# Patient Record
Sex: Female | Born: 1985 | Race: White | Hispanic: No | Marital: Married | Smoking: Never smoker
Health system: Southern US, Community
[De-identification: ages and names within clinical notes are randomized; demographics above are authoritative.]

## PROBLEM LIST (undated history)

## (undated) ENCOUNTER — Inpatient Hospital Stay: Payer: Self-pay

## (undated) DIAGNOSIS — T4145XA Adverse effect of unspecified anesthetic, initial encounter: Secondary | ICD-10-CM

## (undated) DIAGNOSIS — D649 Anemia, unspecified: Secondary | ICD-10-CM

## (undated) DIAGNOSIS — F419 Anxiety disorder, unspecified: Secondary | ICD-10-CM

## (undated) DIAGNOSIS — T8859XA Other complications of anesthesia, initial encounter: Secondary | ICD-10-CM

## (undated) HISTORY — PX: NO PAST SURGERIES: SHX2092

---

## 2010-09-15 ENCOUNTER — Ambulatory Visit: Payer: Self-pay | Admitting: Family Medicine

## 2011-10-02 ENCOUNTER — Encounter: Payer: Self-pay | Admitting: Obstetrics & Gynecology

## 2011-10-03 ENCOUNTER — Encounter: Payer: Self-pay | Admitting: Obstetrics & Gynecology

## 2012-02-02 ENCOUNTER — Observation Stay: Payer: Self-pay | Admitting: Obstetrics and Gynecology

## 2012-02-02 LAB — URINALYSIS, COMPLETE
Nitrite: NEGATIVE
Protein: NEGATIVE
Specific Gravity: 1.004 (ref 1.003–1.030)
Squamous Epithelial: <1

## 2012-03-21 ENCOUNTER — Observation Stay: Payer: Self-pay | Admitting: Obstetrics and Gynecology

## 2012-03-28 ENCOUNTER — Inpatient Hospital Stay: Payer: Self-pay | Admitting: Obstetrics and Gynecology

## 2012-03-28 LAB — CBC WITH DIFFERENTIAL/PLATELET
Basophil #: 0 10*3/uL (ref 0.0–0.1)
Eosinophil %: 0.5 %
HCT: 40.3 % (ref 35.0–47.0)
HGB: 13.4 g/dL (ref 12.0–16.0)
Lymphocyte #: 1.8 10*3/uL (ref 1.0–3.6)
MCH: 32.4 pg (ref 26.0–34.0)
MCV: 98 fL (ref 80–100)
Monocyte #: 1 x10 3/mm — ABNORMAL HIGH (ref 0.2–0.9)
Neutrophil #: 16.2 10*3/uL — ABNORMAL HIGH (ref 1.4–6.5)
RBC: 4.12 10*6/uL (ref 3.80–5.20)

## 2012-03-28 LAB — RAPID HIV-1/2 QL/CONFIRM: HIV-1/2,Rapid Ql: NEGATIVE

## 2012-12-04 ENCOUNTER — Emergency Department: Payer: Self-pay | Admitting: Emergency Medicine

## 2012-12-04 LAB — COMPREHENSIVE METABOLIC PANEL
Albumin: 4 g/dL (ref 3.4–5.0)
Alkaline Phosphatase: 58 U/L (ref 50–136)
Anion Gap: 5 — ABNORMAL LOW (ref 7–16)
Bilirubin,Total: 0.4 mg/dL (ref 0.2–1.0)
Calcium, Total: 8.6 mg/dL (ref 8.5–10.1)
Chloride: 108 mmol/L — ABNORMAL HIGH (ref 98–107)
Creatinine: 0.68 mg/dL (ref 0.60–1.30)
Glucose: 85 mg/dL (ref 65–99)
SGOT(AST): 16 U/L (ref 15–37)
SGPT (ALT): 17 U/L (ref 12–78)

## 2012-12-04 LAB — URINALYSIS, COMPLETE
Bilirubin,UR: NEGATIVE
Blood: NEGATIVE
Glucose,UR: NEGATIVE mg/dL (ref 0–75)
Ph: 7 (ref 4.5–8.0)
Protein: NEGATIVE
RBC,UR: <1 /HPF (ref 0–5)
Specific Gravity: 1.002 (ref 1.003–1.030)
Squamous Epithelial: 2
WBC UR: 1 /HPF (ref 0–5)

## 2012-12-04 LAB — CBC WITH DIFFERENTIAL/PLATELET
Basophil %: 0.6 %
Eosinophil #: 0.1 10*3/uL (ref 0.0–0.7)
Lymphocyte %: 23.2 %
MCH: 31.4 pg (ref 26.0–34.0)
MCHC: 34.1 g/dL (ref 32.0–36.0)
Monocyte #: 0.4 x10 3/mm (ref 0.2–0.9)
Neutrophil #: 3.3 10*3/uL (ref 1.4–6.5)
Neutrophil %: 67.2 %
Platelet: 131 10*3/uL — ABNORMAL LOW (ref 150–440)
RDW: 13 % (ref 11.5–14.5)

## 2012-12-04 LAB — SEDIMENTATION RATE: Erythrocyte Sed Rate: 13 mm/hr (ref 0–20)

## 2012-12-04 IMAGING — CR DG CHEST 2V
1 series · 2 of 2 positions shown · non-contrast
Comparison: none

REASON FOR EXAM: dyspnea
COMMENTS:

PROCEDURE:     DXR - DXR CHEST PA (OR AP) AND LATERAL  - [DATE] [DATE]
RESULT:     The lungs are clear. The cardiac silhouette and visualized bony
skeleton are unremarkable.

[Series 1: w chest pa · 0.14mm/px · 2 of 2 slices shown]
[im 1/2]
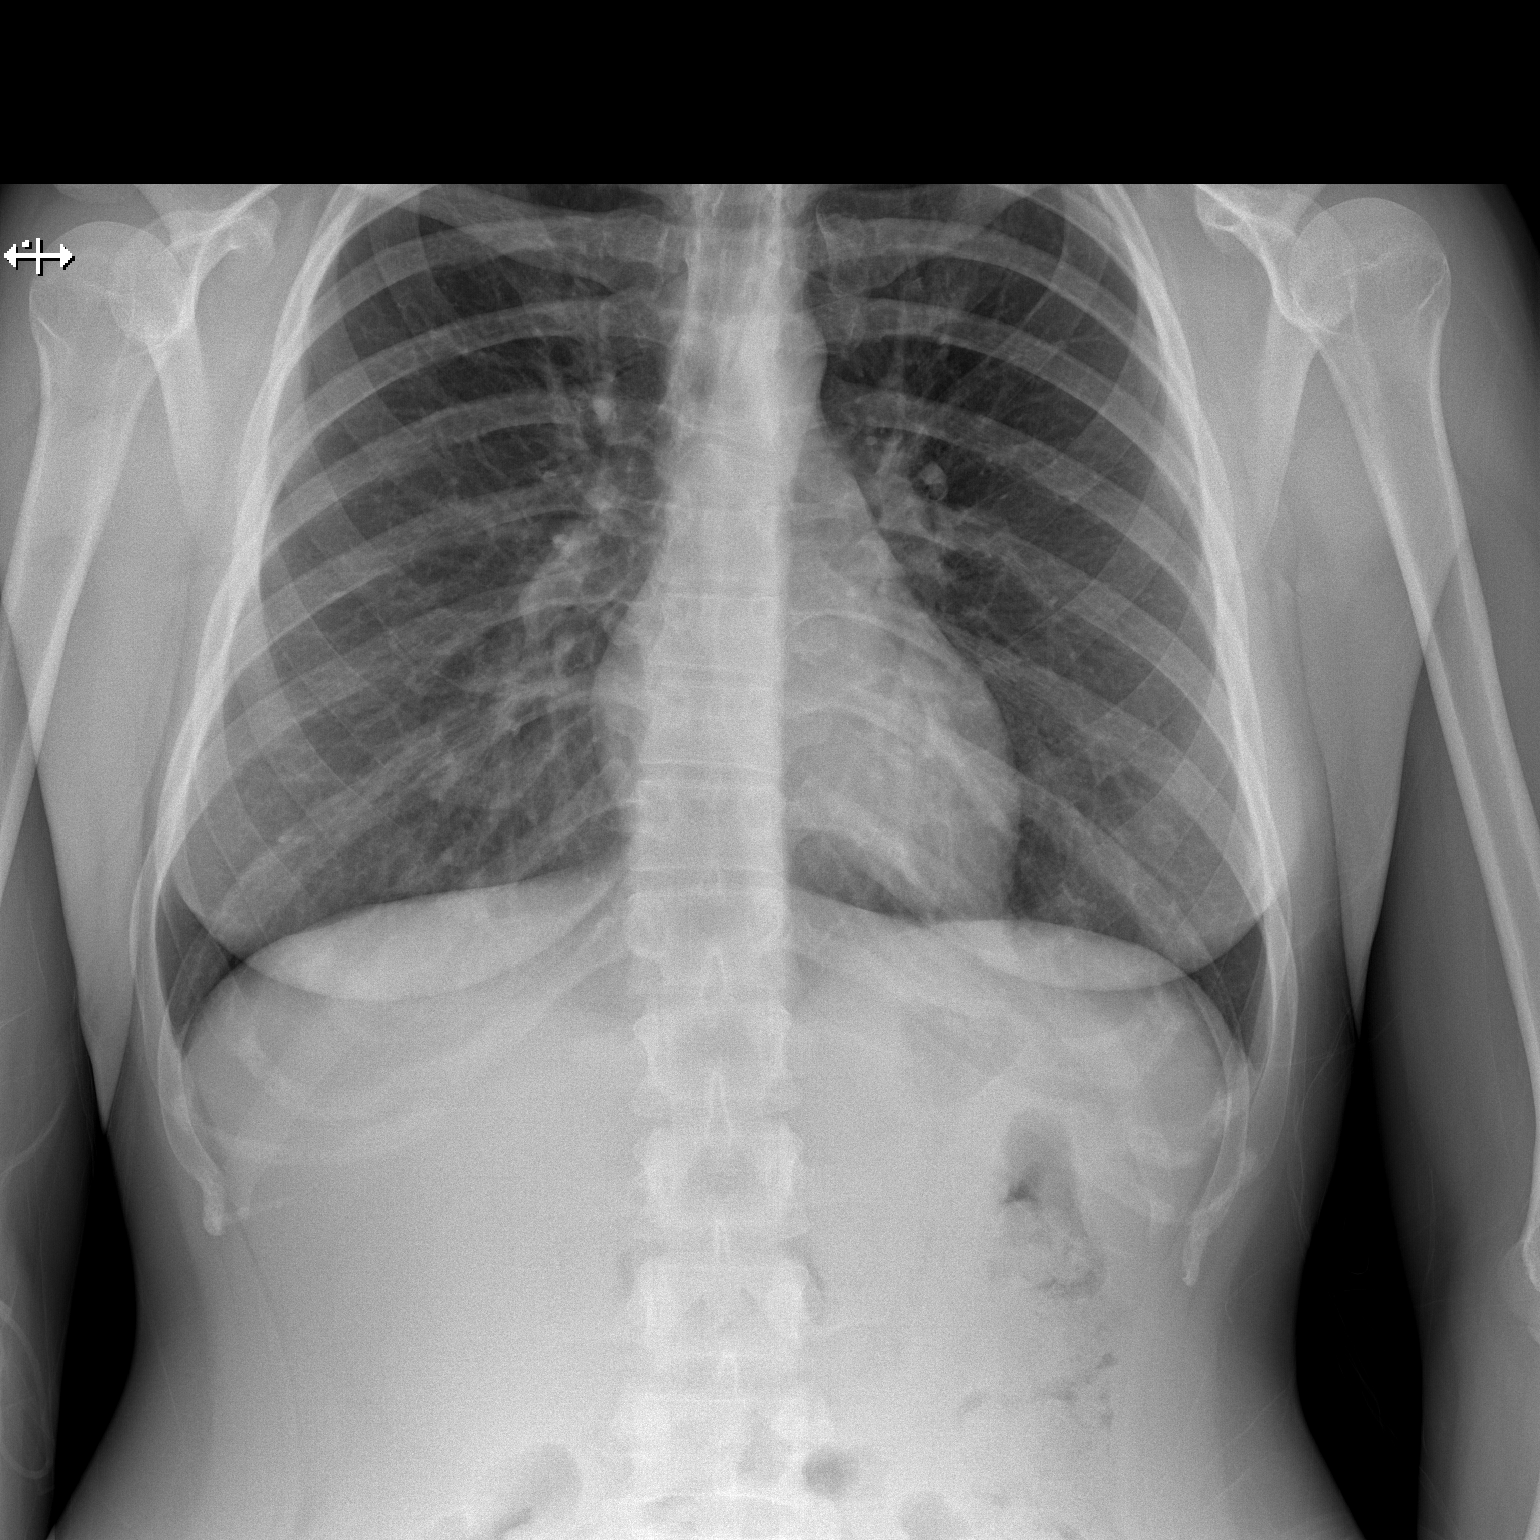
[im 2/2]
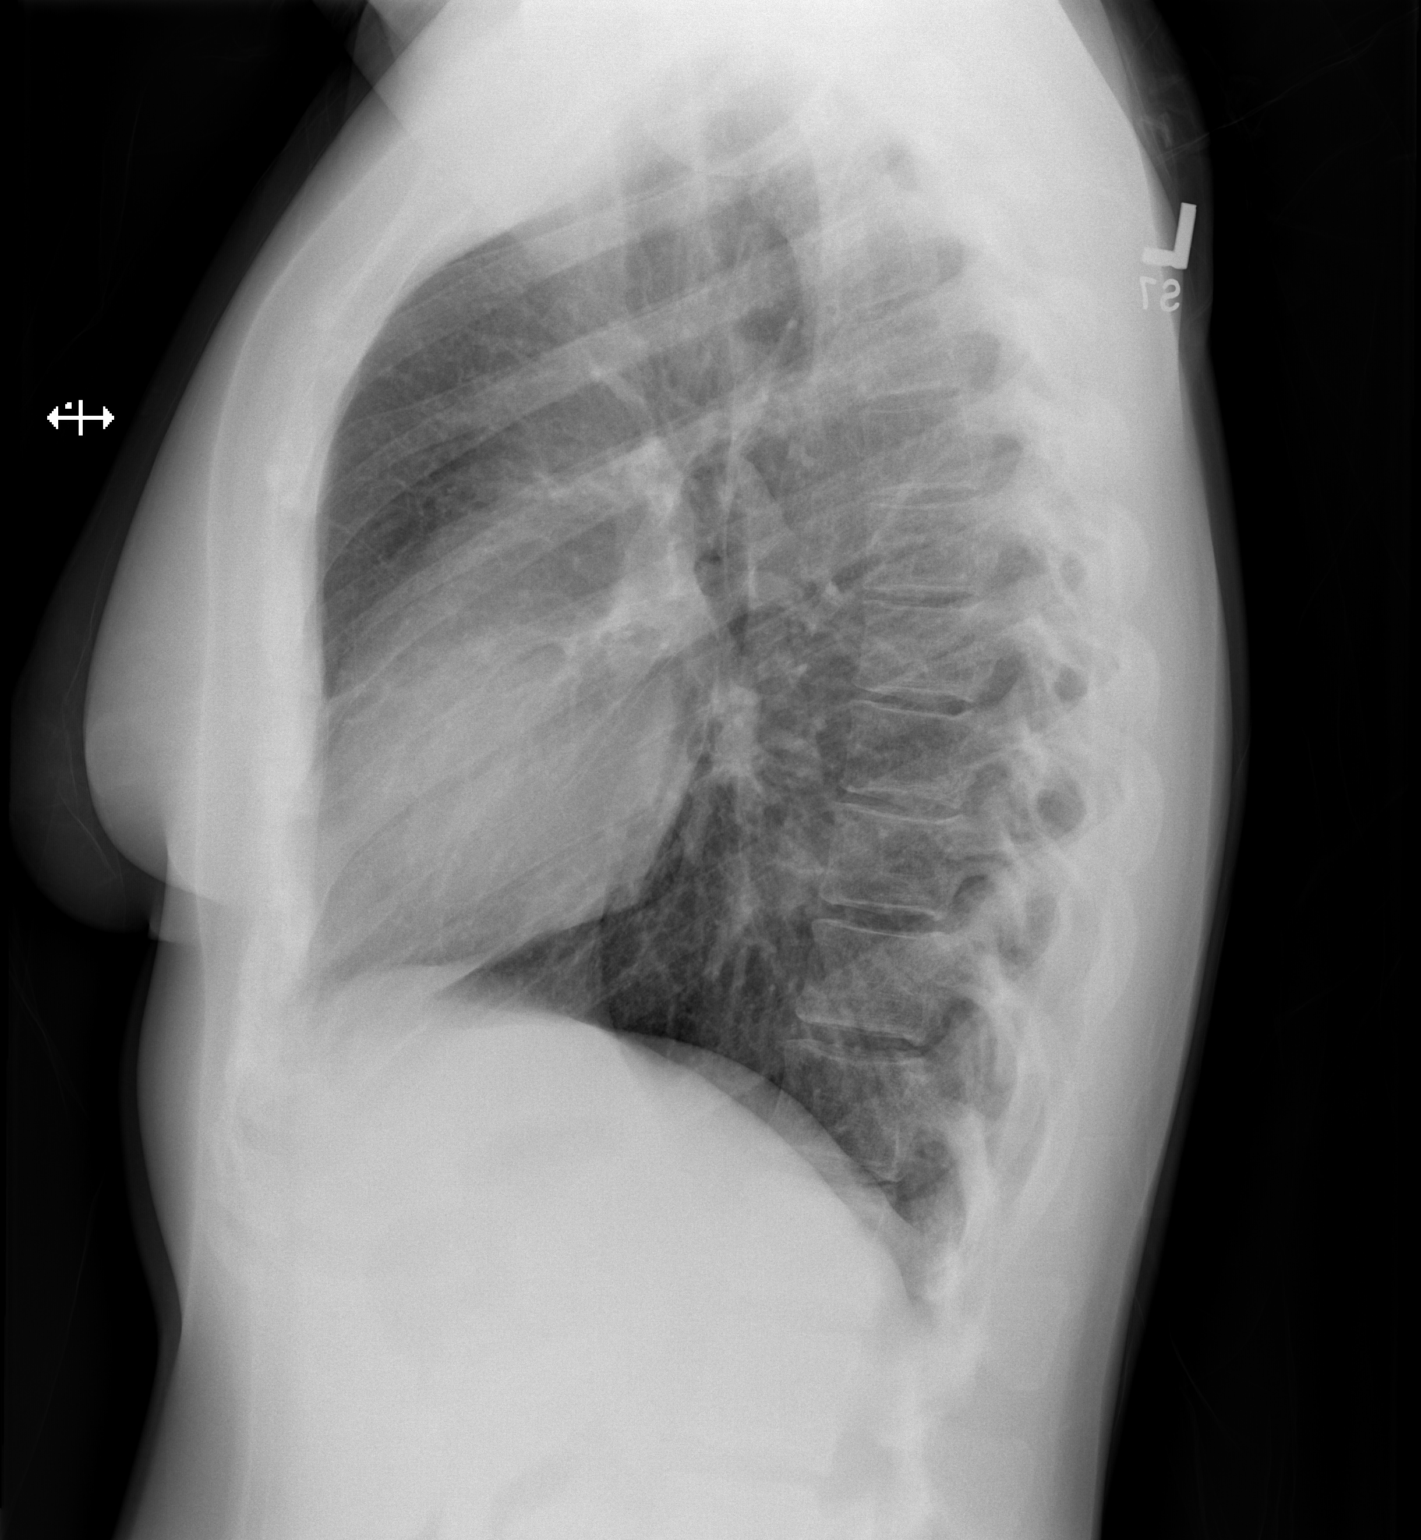

[2 of 2 positions shown; findings below may reference images not displayed]

IMPRESSION: 1. Chest radiograph without evidence of acute cardiopulmonary disease.

## 2015-10-27 ENCOUNTER — Emergency Department: Payer: BC Managed Care – PPO

## 2015-10-27 ENCOUNTER — Emergency Department
Admission: EM | Admit: 2015-10-27 | Discharge: 2015-10-27 | Disposition: A | Discharge status: Home / Self Care | Payer: BC Managed Care – PPO | Attending: Emergency Medicine | Admitting: Emergency Medicine

## 2015-10-27 DIAGNOSIS — R0602 Shortness of breath: Secondary | ICD-10-CM

## 2015-10-27 DIAGNOSIS — Z3202 Encounter for pregnancy test, result negative: Secondary | ICD-10-CM | POA: Insufficient documentation

## 2015-10-27 DIAGNOSIS — F329 Major depressive disorder, single episode, unspecified: Secondary | ICD-10-CM | POA: Insufficient documentation

## 2015-10-27 DIAGNOSIS — R42 Dizziness and giddiness: Secondary | ICD-10-CM | POA: Insufficient documentation

## 2015-10-27 DIAGNOSIS — F419 Anxiety disorder, unspecified: Secondary | ICD-10-CM | POA: Insufficient documentation

## 2015-10-27 DIAGNOSIS — M79601 Pain in right arm: Secondary | ICD-10-CM | POA: Diagnosis not present

## 2015-10-27 DIAGNOSIS — R079 Chest pain, unspecified: Secondary | ICD-10-CM | POA: Insufficient documentation

## 2015-10-27 LAB — BASIC METABOLIC PANEL
ANION GAP: 6 (ref 5–15)
BUN: 9 mg/dL (ref 6–20)
CALCIUM: 9.1 mg/dL (ref 8.9–10.3)
CO2: 26 mmol/L (ref 22–32)
Chloride: 109 mmol/L (ref 101–111)
Creatinine, Ser: 0.64 mg/dL (ref 0.44–1.00)
GFR calc Af Amer: >60 mL/min (ref >60)
GLUCOSE: 103 mg/dL — AB (ref 65–99)
POTASSIUM: 3.6 mmol/L (ref 3.5–5.1)
SODIUM: 141 mmol/L (ref 135–145)

## 2015-10-27 LAB — CBC
HCT: 37.9 % (ref 35.0–47.0)
HEMOGLOBIN: 12.8 g/dL (ref 12.0–16.0)
MCH: 30.1 pg (ref 26.0–34.0)
MCHC: 33.7 g/dL (ref 32.0–36.0)
MCV: 89.3 fL (ref 80.0–100.0)
PLATELETS: 161 10*3/uL (ref 150–440)
RBC: 4.24 MIL/uL (ref 3.80–5.20)
RDW: 13 % (ref 11.5–14.5)
WBC: 9.2 10*3/uL (ref 3.6–11.0)

## 2015-10-27 LAB — TROPONIN I: Troponin I: <0.03 ng/mL (ref <0.031)

## 2015-10-27 LAB — POCT PREGNANCY, URINE: PREG TEST UR: NEGATIVE

## 2015-10-27 MED ORDER — ACETAMINOPHEN 500 MG PO TABS
1000.0000 mg | ORAL_TABLET | Freq: Once | ORAL | Status: AC
  Administered 2015-10-27: 1000 mg via ORAL
  Filled 2015-10-27: qty 2

## 2015-10-27 NOTE — ED Provider Notes (Signed)
[facility] Emergency Department Provider Note  ____________________________________________  Time seen: Approximately 9:53 AM  I have reviewed the triage vital signs and the nursing notes.   HISTORY  Chief Complaint Chest Pain and Anxiety    HPI Janice Randall is a 30 y.o. female with a history of generalized anxiety disorder presenting with chest pain and shortness of breath with right arm pain. Patient reports that she had an overall generalized "not feeling well" sensation when she woke up this morning. While driving, she felt lightheaded. She arrived at work and developed a central chest pain described as "someone sitting on me." This was associated with shortness of breath and right upper extremity pain. No associated nausea or vomiting, diaphoresis, palpitations or syncope. Several days ago, the patient was restarted on sertraline for her anxiety. She had previously been on this medication and stabilized for about a year, then self discontinued it and over the past year her symptoms have worsened. She denies any smoking or cocaine abuse. She is on oral contraceptive pills. No recent travel. No calf tenderness or leg swelling, recent cough or cold symptoms. At this time, her symptoms are improved but she continues to have mild right upper extremity pain and a feeling of generalized tiredness. Patient denies any SI, HI or hallucinations.  FH: And father with CABG in his 54s. No family history of clots.   History reviewed. No pertinent past medical history.  There are no active problems to display for this patient.   History reviewed. No pertinent past surgical history.  No current outpatient prescriptions on file.  Allergies Codeine  No family history on file.  Social History Social History  Substance Use Topics  . Smoking status: Never Smoker   . Smokeless tobacco: None  . Alcohol Use: No    Review of Systems Constitutional: No fever/chills.  Positive lightheadedness. Negative syncope. Eyes: No visual changes. ENT: No sore throat. Cardiovascular: Positive chest pain, without palpitations. Respiratory: Positive shortness of breath.  No cough. Gastrointestinal: No abdominal pain.  No nausea, no vomiting.  No diarrhea.  No constipation. Genitourinary: Negative for dysuria. Musculoskeletal: Negative for back pain. Skin: Negative for rash. Neurological: Negative for headaches, focal weakness or numbness. Psychiatric:Positive anxiety. Denies any SI, HI or hallucinations.  10-point ROS otherwise negative.  ____________________________________________   PHYSICAL EXAM:  VITAL SIGNS: ED Triage Vitals  Enc Vitals Group     BP 10/27/15 0857 127/74 mmHg     Pulse Rate 10/27/15 0857 68     Resp 10/27/15 0857 13     Temp 10/27/15 0900 98.2 F (36.8 C)     Temp src --      SpO2 10/27/15 0857 98 %     Weight 10/27/15 0900 155 lb (70.308 kg)     Height 10/27/15 0900  (1.549 m)     Head Cir --      Peak Flow --      Pain Score --      Pain Loc --      Pain Edu? --      Excl. in GC? --     Constitutional: Alert and oriented. Well appearing and in no acute distress. Answer question appropriately. Eyes: Conjunctivae are normal.  EOMI. no scleral icterus. Head: Atraumatic. Nose: No congestion/rhinnorhea. Mouth/Throat: Mucous membranes are moist.  Neck: No stridor.  Supple.  No JVD. Cardiovascular: Normal rate, regular rhythm. No murmurs, ruVa [facility] SpringsNormal respiratory effort.  No retractions. Lungs CTAB.  No wheezes, rales or ronchi. Gastrointestinal: Soft and nontender. No distention. No peritoneal signs. Musculoskeletal: No LE edema. No palpable cords, tenderness to palpation in the calves. Negative Homans sign. Neurologic:  Normal speech and language. No gross focal neurologic deficits are appreciated.  Skin:  Skin is warm, dry and intact. No rash noted. Psychiatric: Depressed mood with flat affect.  Speech and behavior are normal.  Normal judgement.  ____________________________________________   LABS (all labs ordered are listed, but only abnormal results are displayed)  Labs Reviewed  BASIC METABOLIC PANEL - Abnormal; Notable for the following:    Glucose, Bld 103 (*)    All other components within normal limits  CBC  TROPONIN I  POC URINE PREG, ED  POCT PREGNANCY, URINE   ____________________________________________  EKG  ED ECG REPORT I, Rockne Menghini, the attending physician, personally viewed and interpreted this ECG.   Date: 10/27/2015   Rate: Normal  Rhythm: normal sinus rhythm  Axis: Normal  Intervals:none  ST&T Change: No ST elevation  ____________________________________________  RADIOLOGY  Dg Chest 2 View  10/27/2015  CLINICAL DATA:  Chest pain this morning at central chest, nausea, dizziness, remote asthma EXAM: CHEST  2 VIEW COMPARISON:  12/04/2012 FINDINGS: Normal heart size, mediastinal contours, and pulmonary vascularity. Lungs clear. No pneumothorax. Bones unremarkable. IMPRESSION: Normal exam. Electronically Signed   By: Ulyses Southward M.D.   On: 10/27/2015 09:33    ____________________________________________   PROCEDURES  Procedure(s) performed: None  Critical Care performed: No ____________________________________________   INITIAL IMPRESSION / ASSESSMENT AND PLAN / ED COURSE  Pertinent labs & imaging results that were available during my care of the patient were reviewed by me and considered in my medical decision making (see chart for details).  30 y.o. female with a history of generalized anxiety recently restarted on sertraline presenting with an episode of chest pain, shortness of breath and right upper extremity pain. At this time, the patient is hemodynamically stable and nontoxic appearing. Her symptoms are significantly better. The patient does report increasing anxiety symptoms although has not had an episode similar to  this related to her anxiety in the past. Her risk for ACS or MI is very low with no personal medical or social risk factors and very few family history risk factors. She does not have any evidence of DVT, and although she is on birth control, the risk of PE is extremely low. I will plan to evaluate her EKG, chest x-ray, and labs. I have encouraged her to follow up with her primary care physician for further evaluation. If her workup in the emergency department is reassuring, she will be discharged home and understands return precautions as well as follow-up instructions.  ____________________________________________  FINAL CLINICAL IMPRESSION(S) / ED DIAGNOSES  Final diagnoses:  Chest pain, unspecified chest pain type  Shortness of breath  Right arm pain  Anxiety      NEW MEDICATIONS STARTED DURING THIS VISIT:  There are no discharge medications for this patient.    Rockne Menghini, MD 10/27/15 1514

## 2015-10-27 NOTE — Discharge Instructions (Signed)
These continue to take the sertraline as prescribed. Please drink plenty of fluid and get plenty of rest.  Return to the emergency department if you develop severe pain, palpitations or lightheadedness, fainting, thoughts of hurting you're self or anyone else, hallucinations, or any other symptoms concerning to you.

## 2015-10-27 NOTE — ED Notes (Signed)
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 Reports chest pain that is now gone on assessment. Hx of anxiety and depression and requesting consult

## 2015-10-27 NOTE — ED Notes (Signed)
Patient transported to X-ray 

## 2017-07-03 DIAGNOSIS — O0993 Supervision of high risk pregnancy, unspecified, third trimester: Secondary | ICD-10-CM | POA: Insufficient documentation

## 2017-08-20 ENCOUNTER — Other Ambulatory Visit: Payer: Self-pay | Admitting: Obstetrics and Gynecology

## 2017-08-20 DIAGNOSIS — Z3689 Encounter for other specified antenatal screening: Secondary | ICD-10-CM

## 2017-09-03 ENCOUNTER — Ambulatory Visit: Payer: 59

## 2017-09-03 ENCOUNTER — Ambulatory Visit (HOSPITAL_BASED_OUTPATIENT_CLINIC_OR_DEPARTMENT_OTHER)
Admission: RE | Admit: 2017-09-03 | Discharge: 2017-09-03 | Disposition: A | Discharge status: Home / Self Care | Payer: 59 | Source: Ambulatory Visit | Attending: Maternal and Fetal Medicine | Admitting: Maternal and Fetal Medicine

## 2017-09-03 ENCOUNTER — Ambulatory Visit
Admission: RE | Admit: 2017-09-03 | Discharge: 2017-09-03 | Disposition: A | Discharge status: Home / Self Care | Payer: 59 | Source: Ambulatory Visit | Attending: Maternal and Fetal Medicine | Admitting: Maternal and Fetal Medicine

## 2017-09-03 DIAGNOSIS — Z3A23 23 weeks gestation of pregnancy: Secondary | ICD-10-CM | POA: Insufficient documentation

## 2017-09-03 DIAGNOSIS — O358XX0 Maternal care for other (suspected) fetal abnormality and damage, not applicable or unspecified: Secondary | ICD-10-CM | POA: Insufficient documentation

## 2017-09-03 DIAGNOSIS — Z3689 Encounter for other specified antenatal screening: Secondary | ICD-10-CM

## 2017-09-03 DIAGNOSIS — O09899 Supervision of other high risk pregnancies, unspecified trimester: Secondary | ICD-10-CM | POA: Diagnosis not present

## 2017-09-03 NOTE — Progress Notes (Addendum)
Length of Consultation: 20 minutes  [name] [facility]   A thorough evaluation of the fetal anatomy was performed, and a single umbilical artery, also known as a 2 vessel cord, was noted.  Within the limits of the procedure, all other fetal anatomy appeared normal.  The patient then received genetic counseling.  This is a summary of our discussion.  As we discussed, the umbilical cord typically contains two arteries, which return waste materials and oxygen-poor blood to the mother from the baby. There is also one vein which brings oxygen rich blood to the baby.  In at least 1% of pregnancies, there is only one umbilical artery present.  The majority of these newborns are normal and healthy and have no other complications.  When a single umbilical artery is seen on ultrasound, the chance for other associated birth differences is known to be increased. Approximately 20% of infants with a single umbilical artery have various physical or structural abnormalities, although this number varies in the literature. The remainder have no apparent abnormalities.  In the absence of other birth differences, we know that the chance for low birth weight, fetal growth restriction, and premature delivery may still be a concern.     If no other abnormalities are seen, the prognosis with a single umbilical artery is usually good.  We discussed that the rest of the ultrasound was within normal limits and no other physical abnormalities were noted.  We discussed that a follow-up ultrasound in the third trimester should be performed to check the growth of the baby.     We discussed that there is a slight association between single umbilical arteries and chromosome abnormalities. Presence of a single umbilical artery somewhat increases the risk for a fetal chromosome condition (compared to baseline maternal age or screen related risk for a chromosome  condition before the single umbilical artery was found).  The increase in risk for a chromosome problem is more significant if there are other ultrasound markers or abnormalities, and less significant if single umbilical artery is the only atypical ultrasound finding. Chromosomes are the inherited structures that contain our genes (traits).  Each cell of our body normally has 46 chromosomes, matched up in 23 pairs.  The last pair determines our gender and are called the sex chromosomes.  A female has an X and a Y chromosome, while a female has two X chromosomes.  Rarely, when a mother's egg or father's sperm unite, an extra or missing chromosome can be passed on to the baby by mistake.  We discussed examples of such a problem including: Down syndrome (an extra 8421) and Edward syndrome (an extra 18), both involving some degree of mental retardation and physical problems.  We also discussed that because of the possibility of other abnormalities, it is sometimes recommended that newborns with a single umbilical artery be thoroughly evaluated.      We discussed the following prenatal testing options for this pregnancy:  Maternal serum marker screening, a blood test that measures pregnancy proteins, can provide risk assessments for Down syndrome, trisomy 18, and open neural tube defects (spina bifida, anencephaly). Because it does not directly examine the fetus, it cannot positively diagnose or rule out these problems.  This testing was previously performed in this pregnancy and was normal.  The chance for an open neural tube defect was reported to be 1 in 10,000; the chance for Down syndrome 1  in 9,??? And the chance for trisomy 18 was not increased.  Amniocentesis involves the removal of a small amount of amniotic fluid from the sac surrounding the fetus ("bag of water") with the use of a thin needle inserted through the mother's abdomen and uterus.  Ultrasound guidance is used throughout the procedure.  Fetal  cells are directly evaluated and >99.5% of chromosome problems and >98% of neural tube defects can be detected.  The main risks to this procedure are complications leading to miscarriage in less than 1 in 200 cases (0.5%).  We also reviewed the availability of cell free fetal DNA testing from maternal blood to determine whether or not the baby may have either Down syndrome, trisomy 3513, or trisomy 4218.  This test utilizes a maternal blood sample and DNA sequencing technology to isolate circulating cell free fetal DNA from maternal plasma.  The fetal DNA can then be analyzed for DNA sequences that are derived from the three most common chromosomes involved in aneuploidy, chromosomes 13, 18, and 21.  If the overall amount of DNA is greater than the expected level for any of these chromosomes, aneuploidy is suspected. While we do not consider it a replacement for invasive testing and karyotype analysis, a negative result from this testing would be reassuring, though not a guarantee of a normal chromosome complement for the baby.  An abnormal result is certainly suggestive of an abnormal chromosome complement, though we would still recommend CVS or amniocentesis to confirm any findings from this testing.  Ultrasound can sometimes detect other fetal anatomy problems (e.g. a heart defect) that suggest a chromosome problem; however, this is often not the case.  An additional ultrasound may be recommended whether or not you choose to have one or both of the above tests to continue to evaluate the fetal anatomy and growth as a precautionary measure.  Again, we recommended another ultrasound in the third trimester.    Fetal echocardiography is a special ultrasound to examine to fetal heart.  This test can be done after [redacted] weeks gestation to look for heart defects.  Because babies with an SUA are more likely to have a structural heart defect, this could be considered.  There is no risk to the pregnancy from this  procedure.  We inquired briefly about the family history.  [name] reported that her brother was born with an open neural tube defect.  He is otherwise in good health, with normal growth and development.  Spina bifida may occur as an isolated condition or along with other physical or developmental differences in numerous genetic syndromes.  In the absence of a known genetic syndrome, we would expect that his condition was due to multifactorial inheritance with a recurrence chance in this pregnancy of approximately 1%.  The normal ultrasound today and normal maternal serum AFP screening significantly reduce this chance.  The remainder of the family history was reported to be unremarkable for birth defects, developmental delays, recurrent pregnancy loss or known chromosome abnormalities.  After consideration of the options, the patient elected to declined any additional testing for chromosome conditions.  Thank you for involving us in the care of this patient.  If any further questions or concerns arise, please do not hesitate to call us at [phone].    Cherly Andersoneborah F. Wells, MS, CGC  I was present and reviewed this note and agree.

## 2017-10-02 NOTE — L&D Delivery Note (Signed)
Called to bedside for deep variable decels with UCs at 1935; Cat II tracing with min-mod variability noted, baseline 130bpm, variables to 60-70s with UCs with rapid recovery. Maternal position changed, pitocin dc and oxygen via FM.   SVE rechecked ant lip/C/+1; pt with strong urge to push, using nitrous intermittently. Pt pushing well, verbally consented for vac extraction if FHR failed to recover after UC or loss of variability. Pt pushed effectively to crowning, FHR continued to recover to baseline 105-115 after UCs, see complete delivery note below.   Delivery Note  First Stage: Labor onset: 1147 Induction : Pitocin and AROM Analgesia /Anesthesia intrapartum: Nitrous AROM at 1147; clear fluid.   Second Stage: Complete dilation at 1955 Onset of pushing at 1955 FHR second stage: Cat II tracing, variable decels to 50-60bpm with recovery to baseline 115-120; min-mod variability.   Delivery of a viable female 12/23/2017 at  2017  by R McVey CNM delivery of fetal head in LOA position with restitution to LOT. loose nuchal cord x 1;  Anterior shoulder did not deliver easily with gentle downward traction after 10 sec, SP pressure applied with immediate delivery of anterior shoulder followed by posterior shoulder, loose B[place]ose reduced, infant with spontaneous cry and placed on mom's chest, and attended to by peds.  Cord double clamped after cessation of pulsation, cut by FOB. Cord blood sample collected    Third Stage: Placenta delivered spontaneously intact with [facility]VC @ 2024 Placenta disposition: routine disposal Uterine tone firm / bleeding small  Left periurethral laceration identified  Anesthesia for repair: Local  Repair: 3-0 vicryl SH x interrupted stitches x 2 for cosmesis and hemostasis.  Est. Blood Loss (mL): 100ml   Complications: none  Mom to postpartum.  Baby to Couplet care / Skin to Skin.  Newborn: Birth Weight: 7#11  Apgar Scores: 7/9 Feeding planned: breast

## 2017-12-17 ENCOUNTER — Other Ambulatory Visit: Payer: Self-pay

## 2017-12-17 ENCOUNTER — Observation Stay
Admission: EM | Admit: 2017-12-17 | Discharge: 2017-12-18 | Disposition: A | Discharge status: Home / Self Care | Payer: 59 | Attending: Obstetrics and Gynecology | Admitting: Obstetrics and Gynecology

## 2017-12-17 DIAGNOSIS — Z3A38 38 weeks gestation of pregnancy: Secondary | ICD-10-CM | POA: Diagnosis not present

## 2017-12-17 DIAGNOSIS — O99013 Anemia complicating pregnancy, third trimester: Secondary | ICD-10-CM | POA: Diagnosis not present

## 2017-12-17 DIAGNOSIS — Z885 Allergy status to narcotic agent status: Secondary | ICD-10-CM | POA: Diagnosis not present

## 2017-12-17 DIAGNOSIS — Z881 Allergy status to other antibiotic agents status: Secondary | ICD-10-CM | POA: Diagnosis not present

## 2017-12-17 DIAGNOSIS — O471 False labor at or after 37 completed weeks of gestation: Principal | ICD-10-CM | POA: Insufficient documentation

## 2017-12-17 HISTORY — DX: Anxiety disorder, unspecified: F41.9

## 2017-12-17 LAB — RAPID HIV SCREEN (HIV 1/2 AB+AG)
HIV 1/2 Antibodies: NONREACTIVE
HIV-1 P24 Antigen - HIV24: NONREACTIVE

## 2017-12-17 LAB — CBC
HCT: 37 % (ref 35.0–47.0)
HEMOGLOBIN: 12.6 g/dL (ref 12.0–16.0)
MCH: 32 pg (ref 26.0–34.0)
MCHC: 34.1 g/dL (ref 32.0–36.0)
MCV: 93.6 fL (ref 80.0–100.0)
Platelets: 164 10*3/uL (ref 150–440)
RBC: 3.95 MIL/uL (ref 3.80–5.20)
RDW: 13.7 % (ref 11.5–14.5)
WBC: 11.2 10*3/uL — ABNORMAL HIGH (ref 3.6–11.0)

## 2017-12-17 LAB — TYPE AND SCREEN
ABO/RH(D): O POS
ANTIBODY SCREEN: NEGATIVE

## 2017-12-17 MED ORDER — SOD CITRATE-CITRIC ACID 500-334 MG/5ML PO SOLN: 30.0000 mL | ORAL | Status: DC | PRN

## 2017-12-17 MED ORDER — SODIUM CHLORIDE 0.9% FLUSH
3.0000 mL | Freq: Two times a day (BID) | INTRAVENOUS | Status: DC
  Administered 2017-12-17 – 2017-12-18 (×2): 3 mL via INTRAVENOUS

## 2017-12-17 MED ORDER — SODIUM CHLORIDE 0.9% FLUSH: 3.0000 mL | INTRAVENOUS | Status: DC | PRN

## 2017-12-17 MED ORDER — FENTANYL CITRATE (PF) 100 MCG/2ML IJ SOLN
50.0000 ug | INTRAMUSCULAR | Status: DC | PRN
  Administered 2017-12-17: 100 ug via INTRAVENOUS
  Filled 2017-12-17: qty 2

## 2017-12-17 MED ORDER — SODIUM CHLORIDE 0.9 % IV SOLN: 250.0000 mL | INTRAVENOUS | Status: DC | PRN

## 2017-12-17 MED ORDER — LACTATED RINGERS IV SOLN: 500.0000 mL | INTRAVENOUS | Status: DC | PRN

## 2017-12-17 MED ORDER — ZOLPIDEM TARTRATE 5 MG PO TABS
5.0000 mg | ORAL_TABLET | Freq: Every evening | ORAL | Status: DC | PRN
  Administered 2017-12-17: 5 mg via ORAL
  Filled 2017-12-17: qty 1

## 2017-12-17 NOTE — OB Triage Note (Addendum)
Janice Randall is a 32 y.o. female. She is at 3243w1dLesle Reek gestation. No LMP recorded. Patient is pregnant. Estimated Date of Delivery: 12/30/17  [facility]   Current pregnancy complicated by:  1. 2 vessel cord- normal growth at 35wks: 11/28/17-vertex, EFW-2779 g @ 56%, AFI-209 mm @ 78%, ant plac 2. Mild anemia, PO iron  Chief complaint: painful UCs since around 10am; having small amt bloody show. Denies LOF, good FM noted.    S: Resting comfortably except for breathing with UCs. no LOF,  Active fetal movement. Denies: HA, visual changes, SOB, or RUQ/epigastric pain; having scant bloody show.   Maternal Medical History:   Past Medical History:  Diagnosis Date  . Anxiety     History reviewed. No pertinent surgical history.  Allergies  Allergen Reactions  . Lexapro [Escitalopram Oxalate] Shortness Of Breath and Other (See Comments)    Difficulty breathing  . Codeine Rash  . Flagyl [Metronidazole] Rash    Prior to Admission medications   Medication Sig Start Date End Date Taking? Authorizing Provider  ferrous sulfate 325 (65 FE) MG EC tablet Take 325 mg by mouth 3 (three) times daily with meals.   Yes [provider]  Prenatal Vit-Fe Fumarate-FA (PRENATAL MULTIVITAMIN) TABS tablet Take 1 tablet by mouth daily at 12 noon.   Yes [provider]      Social History: She  reports that  has never smoked. she has never used smokeless tobacco. She reports that she does not drink alcohol or use drugs.  Family History: family history is not on file.  no history of gyn cancers  Review of Systems: A full review of systems was performed and negative except as noted in the HPI.      O:  BP 114/62 (BP Location: Left Arm)   Pulse 80   Temp 98.2 F (36.8 C) (Oral)   Resp 16   Ht 5\' 1"  (1.549 m)   Wt 190 lb (86.2 kg)   SpO2 100%   BMI 35.90 kg/m  No results found for this or any previous visit (from the past 48 hour(s)).   Constitutional:  NAD, AAOx3  HE/ENT: extraocular movements grossly intact, moist mucous membranes CV: RRR PULM: nl respiratory effort, CTABL     Abd: gravid, non-tender, non-distended, soft      Ext: Non-tender, Nonedematous   Psych: mood appropriate, speech normal Pelvic: Dilation: 4 Effacement (%): 50 Cervical Position: Posterior Station: Ballotable Presentation: Vertex Exam by:: Marcel Gary CNM Small amt bloody show on glove.   Fetal  monitoring: Cat I Baseline: 125bpm Variability: moderate Accelerations:  present x >2 Decelerations absent Time 20mins    A/P: 32 y.o. 9143w1d here for antenatal surveillance for prodromal labor.   Labor: early labor  Fetal Wellbeing: Reassuring Cat 1 tracing.  Reactive NST   Plan to Observe overnight, offer IVPM, FHR and VS q4h. Monitor for signs of active labor overnight, recheck SVE prn. Discussed POC with pt and spouse who agree, Pt does not desire labor augmentation or epidural.    Gracynn Rajewski A, CNM 12/17/2017 6:03 PM

## 2017-12-17 NOTE — OB Triage Note (Signed)
Pt presents c/o ctx that started around 10:00 this morning. States they have averaged 5 mins apart for the past hour. Pt has had plenty to eat and drink today. Pt did have intercourse this morning.  Pt denies any bleeding or LOF. Reports positive fetal movement. Vitals WNL. Will continue to monitor.

## 2017-12-18 DIAGNOSIS — O471 False labor at or after 37 completed weeks of gestation: Secondary | ICD-10-CM | POA: Diagnosis not present

## 2017-12-18 NOTE — Discharge Instructions (Signed)
Instructions for Patients & Families  °Term Labor  ° °What is Labor?  °· Labor is when you have contractions and your cervix opening to the baby) opens up or dilates.  °· Labor pains or contractions feel like tightening or pain of your uterus or belly.  °· True labor pains will get stronger and closer together.  ° °When should I come to the hospital?  °· When the labor pains are every 5 minutes or closer for 1-2 hour it is time to come to the hospital. °· Count contractions from when you first feel a contraction to the start of the next contraction.  °· If your baby is feet or butt down (breech) or you have had a C-Section before, come to the hospital when contractions are 10 minutes apart or less for 1 hour.  °· When your bag of water breaks (even if you are not having contractions)  ° °How do I know if my water bag is broken?  °· Most women have a “gush” of fluid. If you are leaking water from the vagina all the time it can also mean the water bag is broken.  °· If you think your bag of water is broken, but you are not sure, you need to come to the hospital. Simple tests can be done by your provider to determine if the bag of water is broken.  °  °If you have any further questions, please contact your provider (Doctor, nurse practitioner or nurse midwife).  ° °Call your provider or come back to the hospital if you have any of the following:  °· 4 or more contractions in one hour before 37 weeks of pregnancy  °· Leakage of fluid or think your water breaks  °· Bright red bleeding from your vagina  °· Fever greater than 100.4  °· Decrease in your baby’s normal movements  °· If you feel a decrease in your baby’s normal movements and are greater than 28 weeks you should do kick counts. You should count your baby’s movements at least once a day for 2 hours while lying down on your side. You should feel at least 10 movements in this time period.  ° ° °

## 2017-12-18 NOTE — Discharge Summary (Signed)
Patient ID: Lesle ReekJoy Zappone MRN: [id number] DOB/AGE: 33/12/1985 33 y.o.  Admit date: 12/17/2017 Discharge date: 12/18/2017  Admission Diagnoses: Prodromal labor  Discharge Diagnoses: False labor after 37wks  Prenatal Procedures: NST  [facility]   Estimated Date of Delivery: 12/30/17  Current pregnancy complicated by:  1. 2 vessel cord- normal growth at 35wks: 11/28/17-vertex, EFW-2779 g @ 56%, AFI-209 mm @ 78%, ant plac 2. Mild anemia, PO iron    Significant Diagnostic Studies:  Results for orders placed or performed during the hospital encounter of 12/17/17 (from the past 168 hour(s))  CBC   Collection Time: 12/17/17  6:30 PM  Result Value Ref Range   WBC 11.2 (H) 3.6 - 11.0 K/uL   RBC 3.95 3.80 - 5.20 MIL/uL   Hemoglobin 12.6 12.0 - 16.0 g/dL   HCT 40.937.0 81.135.0 - 91.447.0 %   MCV 93.6 80.0 - 100.0 fL   MCH 32.0 26.0 - 34.0 pg   MCHC 34.1 32.0 - 36.0 g/dL   RDW 78.213.7 95.611.5 - 21.314.5 %   Platelets 164 150 - 440 K/uL  Rapid HIV screen (HIV 1/2 Ab+Ag)   Collection Time: 12/17/17  6:30 PM  Result Value Ref Range   HIV-1 P24 Antigen - HIV24 NON REACTIVE NON REACTIVE   HIV 1/2 Antibodies NON REACTIVE NON REACTIVE   Interpretation (HIV Ag Ab)      A non reactive test result means that HIV 1 or HIV 2 antibodies and HIV 1 p24 antigen were not detected in the specimen.  Type and screen [facility]   Collection Time: 12/17/17  6:30 PM  Result Value Ref Range   ABO/RH(D) O POS    Antibody Screen NEG    Sample Expiration      12/20/2017 Performed at [facility], [address] [place], [place] [address]     Treatments: PO sleep medication, NST  Hospital Course:  This is a 32 y.o. G2P1 with IUP at 7659w2d admitted for painful UCs for several hours, noted to have a cervical exam of 4cm. No leaking of fluid and scant blood tinged mucus. She ambulated for over an hour and was rechecked, no cervical change noted. Discussed POC with pt and  spouse, due to living some distance from hospital, opted for overnight observation to determine if labor will progress. She was given a dose of Ambien for sleep. She was able to rest overnight. This morning, pt reports irregular uterine cramping, less painful than yesterday. Her cervical exam was rechecked and noted to be unchanged from admission. She had a small amount of dark red bleeding, but none since, presumed to be from cervical exams and previous dilation. NST was completed prior to DC and found to be reactive. Discussed potential IOL at 39wks. She was deemed stable for discharge to home with outpatient follow up.  Discharge Physical Exam:  BP (!) 113/56 (BP Location: Left Arm)   Pulse 67   Temp 97.7 F (36.5 C) (Oral)   Resp 18   Ht 5\' 1"  (1.549 m)   Wt 86.2 kg (190 lb)   SpO2 100%   BMI 35.90 kg/m   General: NAD CV: RRR Pulm: CTABL, nl effort ABD: s/nd/nt, gravid DVT Evaluation: LE non-ttp, no evidence of DVT on exam.  SVE: 3-4/50/-3, soft/posterior  NST Baseline: 120 Variability: moderate Accelerations:15x15  present x >2 Decelerations: absent Time: 20 minutes  Interpretation: reactive NST, category 1 tracing   Discharge Condition: Stable  Disposition: Discharge disposition:  01-Home or Self Care       Allergies as of 12/18/2017      Reactions   Lexapro [escitalopram Oxalate] Shortness Of Breath, Other (See Comments)   Difficulty breathing   Codeine Rash   Flagyl [metronidazole] Rash      Medication List    TAKE these medications   ferrous sulfate 325 (65 FE) MG EC tablet Take 325 mg by mouth 3 (three) times daily with meals.   prenatal multivitamin Tabs tablet Take 1 tablet by mouth daily at 12 noon.       Signed: ----- Genia Del, CNM 12/18/2017 10:18 AM

## 2017-12-19 LAB — RPR: RPR Ser Ql: NONREACTIVE

## 2017-12-23 ENCOUNTER — Encounter: Payer: Self-pay | Admitting: *Deleted

## 2017-12-23 ENCOUNTER — Inpatient Hospital Stay
Admission: EM | Admit: 2017-12-23 | Discharge: 2017-12-25 | DRG: 807 | Disposition: A | Discharge status: Home / Self Care | Payer: 59 | Attending: Obstetrics and Gynecology | Admitting: Obstetrics and Gynecology

## 2017-12-23 ENCOUNTER — Other Ambulatory Visit: Payer: Self-pay

## 2017-12-23 DIAGNOSIS — Z3A39 39 weeks gestation of pregnancy: Secondary | ICD-10-CM | POA: Diagnosis not present

## 2017-12-23 DIAGNOSIS — D649 Anemia, unspecified: Secondary | ICD-10-CM | POA: Diagnosis present

## 2017-12-23 DIAGNOSIS — O9902 Anemia complicating childbirth: Secondary | ICD-10-CM | POA: Diagnosis present

## 2017-12-23 DIAGNOSIS — Z3483 Encounter for supervision of other normal pregnancy, third trimester: Secondary | ICD-10-CM | POA: Diagnosis present

## 2017-12-23 HISTORY — DX: Other complications of anesthesia, initial encounter: T88.59XA

## 2017-12-23 HISTORY — DX: Anemia, unspecified: D64.9

## 2017-12-23 HISTORY — DX: Adverse effect of unspecified anesthetic, initial encounter: T41.45XA

## 2017-12-23 LAB — CBC
HEMATOCRIT: 36.6 % (ref 35.0–47.0)
HEMOGLOBIN: 12.4 g/dL (ref 12.0–16.0)
MCH: 31.8 pg (ref 26.0–34.0)
MCHC: 33.9 g/dL (ref 32.0–36.0)
MCV: 93.9 fL (ref 80.0–100.0)
Platelets: 153 10*3/uL (ref 150–440)
RBC: 3.89 MIL/uL (ref 3.80–5.20)
RDW: 13.6 % (ref 11.5–14.5)
WBC: 13.2 10*3/uL — ABNORMAL HIGH (ref 3.6–11.0)

## 2017-12-23 LAB — TYPE AND SCREEN
ABO/RH(D): O POS
Antibody Screen: NEGATIVE

## 2017-12-23 MED ORDER — OXYTOCIN 40 UNITS IN LACTATED RINGERS INFUSION - SIMPLE MED
1.0000 m[IU]/min | INTRAVENOUS | Status: DC
  Administered 2017-12-23: 6 m[IU]/min via INTRAVENOUS
  Administered 2017-12-23: 2 m[IU]/min via INTRAVENOUS
  Filled 2017-12-23: qty 1000

## 2017-12-23 MED ORDER — OXYTOCIN BOLUS FROM INFUSION
500.0000 mL | Freq: Once | INTRAVENOUS | Status: AC
  Administered 2017-12-23: 500 mL via INTRAVENOUS

## 2017-12-23 MED ORDER — ACETAMINOPHEN 325 MG PO TABS: 650.0000 mg | ORAL_TABLET | ORAL | Status: DC | PRN

## 2017-12-23 MED ORDER — AMMONIA AROMATIC IN INHA
RESPIRATORY_TRACT | Status: AC
  Filled 2017-12-23: qty 10

## 2017-12-23 MED ORDER — LIDOCAINE HCL (PF) 1 % IJ SOLN
30.0000 mL | INTRAMUSCULAR | Status: DC | PRN
  Filled 2017-12-23: qty 30

## 2017-12-23 MED ORDER — ONDANSETRON HCL 4 MG/2ML IJ SOLN: 4.0000 mg | Freq: Four times a day (QID) | INTRAMUSCULAR | Status: DC | PRN

## 2017-12-23 MED ORDER — DIBUCAINE 1 % RE OINT
1.0000 "application " | TOPICAL_OINTMENT | RECTAL | Status: DC | PRN
  Filled 2017-12-23: qty 28

## 2017-12-23 MED ORDER — OXYTOCIN 10 UNIT/ML IJ SOLN
INTRAMUSCULAR | Status: AC
  Filled 2017-12-23: qty 2

## 2017-12-23 MED ORDER — OXYTOCIN 40 UNITS IN LACTATED RINGERS INFUSION - SIMPLE MED
2.5000 [IU]/h | INTRAVENOUS | Status: DC
  Administered 2017-12-23: 2.5 [IU]/h via INTRAVENOUS

## 2017-12-23 MED ORDER — IBUPROFEN 600 MG PO TABS
600.0000 mg | ORAL_TABLET | Freq: Four times a day (QID) | ORAL | Status: DC
  Administered 2017-12-24 – 2017-12-25 (×7): 600 mg via ORAL
  Filled 2017-12-23 (×7): qty 1

## 2017-12-23 MED ORDER — FENTANYL CITRATE (PF) 100 MCG/2ML IJ SOLN: 50.0000 ug | INTRAMUSCULAR | Status: DC | PRN

## 2017-12-23 MED ORDER — WITCH HAZEL-GLYCERIN EX PADS
1.0000 "application " | MEDICATED_PAD | CUTANEOUS | Status: DC | PRN
  Filled 2017-12-23: qty 100

## 2017-12-23 MED ORDER — LACTATED RINGERS IV SOLN
INTRAVENOUS | Status: DC
  Administered 2017-12-23 (×2): via INTRAVENOUS

## 2017-12-23 MED ORDER — MISOPROSTOL 200 MCG PO TABS
ORAL_TABLET | ORAL | Status: AC
  Filled 2017-12-23: qty 4

## 2017-12-23 MED ORDER — SOD CITRATE-CITRIC ACID 500-334 MG/5ML PO SOLN: 30.0000 mL | ORAL | Status: DC | PRN

## 2017-12-23 MED ORDER — TERBUTALINE SULFATE 1 MG/ML IJ SOLN: 0.2500 mg | Freq: Once | INTRAMUSCULAR | Status: DC | PRN

## 2017-12-23 MED ORDER — LACTATED RINGERS IV SOLN
500.0000 mL | INTRAVENOUS | Status: DC | PRN
  Administered 2017-12-23: 500 mL via INTRAVENOUS

## 2017-12-23 NOTE — Discharge Summary (Signed)
Obstetrical Discharge Summary  Patient Name: Janice Randall DOB: 04/09/1986 MRN: [id number]  Date of Admission: 12/23/2017 Date of Delivery: 12/23/17 Delivered by: Bonnell Public McVey CNM Date of Discharge: 12/23/2017  [facility]  LMP:No LMP recorded. EDC Estimated Date of Delivery: 12/30/17 Gestational Age at Delivery: 4541w0d   Antepartum complications:  1. 2 VC, growth WNL; AP testing 2. Anemia, Iron daily   Admitting Diagnosis: IOL for 2vc at term Secondary Diagnosis:SVD with repair of periurethral lac  Patient Active Problem List   Diagnosis Date Noted  . Labor and delivery, indication for care 12/23/2017  . Labor with prolonged first stage, antepartum 12/17/2017  . Single umbilical artery affecting management of mother, antepartum, single gestation   . Supervision of high risk pregnancy in third trimester 07/03/2017    Augmentation: AROM and Pitocin Complications: None Intrapartum complications/course: see complete delivery note, variable decels, loose [place] x 1, shoulder dystocia <20sec resolved with SP pressure.  Date of Delivery: 12/23/17 Delivered By: Bonnell Public mcVey CNM Delivery Type: spontaneous vaginal delivery Anesthesia: nitrous and local for repair. Placenta: spontaneous Laceration: left perirurethral Episiotomy: none Newborn Data: Live born female  Birth Weight: 7 lb 11 oz (3487 g) APGAR: 7, 9  Newborn Delivery   Time head delivered:  12/23/2017 19:19:00 Birth date/time:  12/23/2017 20:17:00 Delivery type:  Vaginal, Spontaneous       Postpartum Procedures:  Post partum course:  Patient had an uncomplicated postpartum course.  By time of discharge on PPD#2 her pain was controlled on oral pain medications; she had appropriate lochia and was ambulating, voiding without difficulty and tolerating regular diet.  She was deemed stable for discharge to home.     Discharge Physical Exam: BP (!) 108/46   Pulse 78   Temp (!) 97.5 F (36.4 C) (Oral)   Resp 16    Ht 5\' 1"  (1.549 m)   Wt 190 lb (86.2 kg)   SpO2 100%   Breastfeeding? Unknown   BMI 35.90 kg/m   General: NAD CV: RRR Pulm: CTABL, nl effort ABD: s/nd/nt, fundus firm and below the umbilicus Lochia: moderate Perineum: well approximated/intact DVT Evaluation: LE non-ttp, no evidence of DVT on exam. Pt had BM already, voiding well Taking po well  Hemoglobin  Date Value Ref Range Status  12/23/2017 12.4 12.0 - 16.0 g/dL Final   HGB  Date Value Ref Range Status  12/04/2012 13.9 12.0 - 16.0 g/dL Final   HCT  Date Value Ref Range Status  12/23/2017 36.6 35.0 - 47.0 % Final  12/04/2012 40.8 35.0 - 47.0 % Final     Disposition: stable, discharge to home. Baby Feeding: breastmilk Baby Disposition: home with mom  Rh Immune globulin given: n/a Rubella vaccine given: n/a Varicella vaccine given: n/a Tdap vaccine given in AP or PP setting: AP Flu vaccine given in AP or PP setting: AP  Contraception: Nexplanon at 6 weeks  Prenatal Labs:   Blood type/Rh O Pos  Antibody screen neg  Rubella Immune  Varicella Immune  RPR NR  HBsAg Neg  HIV NR  GC neg  Chlamydia neg  Genetic screening negative  1 hour GTT 111  3 hour GTT   GBS neg    Plan:  Janice Randall was discharged to home in good condition. Follow-up appointment with delivering provider in 6 weeks.  Discharge Medications: Fe, Ibuprofen   Signed:  Myrtie Cruisearon W. Jones,RN, MSN, CNM, FNP Certified Nurse Midwife [facility]

## 2017-12-23 NOTE — Progress Notes (Signed)
Advanced directive education request. Education offered to Janice Randall and Janice Randall. Prayer offered for Janice Randall, Janice Randall, and baby Janice Randall (who is anticipated to arrive soon).     12/23/17 1000  Clinical Encounter Type  Visited With Patient and family together  Visit Type Initial  Referral From Nurse

## 2017-12-23 NOTE — Progress Notes (Signed)
Labor Progress Note  Janice Randall is a 32 y.o. G2P1001 at 2272w0d by US at [facility]20wk. She presents to L&D for induction of labor for [facility]2VC and term favorable cx.   Subjective: breathing well with UCs, some back pain with most UCs. Bloody show and leaking clear fluid. Using nitrous for pain mgmt with good relief.   Objective: BP 108/62   Pulse 82   Temp 98.5 F (36.9 C) (Oral)   Resp 16   Ht 5\' 1"  (1.549 m)   Wt 190 lb (86.2 kg)   SpO2 99%   BMI 35.90 kg/m  Notable VS details: reviewed.   Fetal Assessment: FHT:  FHR: 130 bpm, variability: moderate,  accelerations:  Present,  decelerations:  Present 2 variable decels with slower recovery after completion of UC.  Category/reactivity:  Category II UC:   regular, every 1.5-4 minutes; MVUs 170-190; pit at 753mu/min SVE:  Dilation: 6 Effacement (%): 80 Cervical Position: Anterior Station: 0 Presentation: Vertex Exam by:: Mannie Ohlin CNM  Membrane status: ruptured, clear fluid.   Labs: Lab Results  Component Value Date   WBC 13.2 (H) 12/23/2017   HGB 12.4 12/23/2017   HCT 36.6 12/23/2017   MCV 93.9 12/23/2017   PLT 153 12/23/2017    Assessment / Plan: Induction of labor due to [facility]2VC,  progressing well on pitocin; pain well managed with nitrous. Continues to have intermittent Cat II tracing, inadequate MVUs- Pitocin being titrated slowly.   Preeclampsia:  no e/o Pre-e Fetal Wellbeing:  Category II intermittently, moderate variability throughout.   Pain Control: nitrous I/D:  n/a Anticipated MOD:  NSVD   Zameer Borman A, CNM 12/23/2017, 5:58 PM

## 2017-12-23 NOTE — H&P (Signed)
OB History & Physical   History of Present Illness:  Chief Complaint:   HPI:  Janice Randall is a 32 y.o. 962P1001 female at 6231w0d dated by US at [facility]20wk.  She presents to L&D for induction of labor for [facility]2VC and term favorable cx.   Active FM, no LOF/SROM. Irreg UCs since dc home from hospital on Tues.  bloody show: brownish discharge.     Pregnancy Issues: 1. 2 VC, growth WNL 2. Anemia, Iron daily   Maternal Medical History:   Past Medical History:  Diagnosis Date  . Anemia   . Anxiety   . Complication of anesthesia     Past Surgical History:  Procedure Laterality Date  . NO PAST SURGERIES      Allergies  Allergen Reactions  . Lexapro [Escitalopram Oxalate] Shortness Of Breath and Other (See Comments)    Difficulty breathing  . Codeine Rash  . Flagyl [Metronidazole] Rash    Prior to Admission medications   Medication Sig Start Date End Date Taking? Authorizing Provider  ferrous sulfate 325 (65 FE) MG EC tablet Take 325 mg by mouth 3 (three) times daily with meals.   Yes [provider]  Prenatal Vit-Fe Fumarate-FA (PRENATAL MULTIVITAMIN) TABS tablet Take 1 tablet by mouth daily at 12 noon.   Yes [provider]     [facility]   Social History: She  reports that she has never smoked. She has never used smokeless tobacco. She reports that she does not drink alcohol or use drugs.  Family History: family history is not on file.   Review of Systems: A full review of systems was performed and negative except as noted in the HPI.     Physical Exam:  Vital Signs: BP 114/69 (BP Location: Left Arm)   Pulse 70   Temp 98.1 F (36.7 C) (Oral)   Resp 18  General: no acute distress.  HEENT: normocephalic, atraumatic Heart: regular rate & rhythm.  No murmurs/rubs/gallops Lungs: clear to auscultation bilaterally, normal respiratory effort Abdomen: soft, gravid, non-tender;  EFW: 7.5lbs Pelvic:   External: Normal external  female genitalia  Cervix:  Dilation: 4 Effacement (%): 50 Cervical Position: Posterior Station: -1 Presentation: Vertex Exam by:: R.Luan Maberry cx soft/posterior. AROM performed, small amt clear fluid.   Extremities: non-tender, symmetric, trace edema bilaterally.  DTRs: 2+ Neurologic: Alert & oriented x 3.    No results found for this or any previous visit (from the past 24 hour(s)).  Pertinent Results:  Prenatal Labs: Blood type/Rh O Pos  Antibody screen neg  Rubella Immune  Varicella Immune  RPR NR  HBsAg Neg  HIV NR  GC neg  Chlamydia neg  Genetic screening negative  1 hour GTT 111  3 hour GTT   GBS neg  rec'd Tdap abd Flu antepartum.   FHT: 125bpm, mod variability, + accels, no decels TOCO: q992min, Pitocin at 264mu/min    Cephalic by leopolds  No results found.  Assessment:  Janice Randall is a 32 y.o. G2P1001 female at 4731w0d with IOL at term for favorable cx and 2VC. Marland Kitchen.   Plan:  1. Admit to Labor & Delivery; consents reviewed and obtained  2. Fetal Well being  - Fetal Tracing: Cat I - Group B Streptococcus ppx indicated: neg - Presentation: cephalic confirmed by exam   3. Routine OB: - Prenatal labs reviewed, as above - Rh O pos - CBC, T&S, RPR on admit - Clear fluids, IVF  4. Induction of Labor -  Contractions: external toco in place -  Pelvis proven to 8#3 -  Plan for induction with PItocin and AROM -  Plan for continuous fetal monitoring  -  Maternal pain control as desired; Desires IVPM or nitrous when pain mgmt needed.  - Anticipate vaginal delivery  5. Post Partum Planning: - Infant feeding: breast - Contraception: undecided  Kamaya Keckler A, CNM 12/23/17 9:15 AM

## 2017-12-23 NOTE — Progress Notes (Signed)
Labor Progress Note  Janice Randall is a 32 y.o. G2P1001 at 1625w0d by US at [facility]20wk.  She presents to L&D for induction of labor for [facility]2VC and term favorable cx.   Subjective: feeling increased pain and pressure with UCs, states "feels like something snapped"  Objective: BP 108/62   Pulse 82   Temp 98.5 F (36.9 C) (Oral)   Resp 16   Ht 5\' 1"  (1.549 m)   Wt 190 lb (86.2 kg)   BMI 35.90 kg/m  Notable VS details: reviewed  Fetal Assessment: FHT:  FHR: 130 bpm, variability: moderate,  accelerations:  Present,  decelerations:  Present recurrent variables with good recovery before end of UC. maternal positon changes, pitocin Dc and IVbolus given with resolution of decels.  Category/reactivity:  Category II UC:   regular, every 2-3 minutes SVE: 6/75/-1, soft/anterior.   IUPC and FSE placed, pt tol well.   Membrane status: AROM at 1147 Amniotic color: Clear, large amt.   Labs: Lab Results  Component Value Date   WBC 13.2 (H) 12/23/2017   HGB 12.4 12/23/2017   HCT 36.6 12/23/2017   MCV 93.9 12/23/2017   PLT 153 12/23/2017    Assessment / Plan: Induction of labor due to [facility]2VC,  progressing well on pitocin; Cat II tracing now resolved.   Labor: Progressing well, will restart Pitocin in 30min.  Preeclampsia:  no e/o Pre-e Fetal Wellbeing:  Category II resolving.  Pain Control:  labor support, considering nitrous.  I/D:  n/a Anticipated MOD:  NSVD  McVey, REBECCA A, CNM 12/23/2017, 3:02 PM

## 2017-12-24 LAB — CBC
HEMATOCRIT: 37.3 % (ref 35.0–47.0)
HEMOGLOBIN: 12.3 g/dL (ref 12.0–16.0)
MCH: 31.2 pg (ref 26.0–34.0)
MCHC: 32.9 g/dL (ref 32.0–36.0)
MCV: 94.7 fL (ref 80.0–100.0)
Platelets: 146 10*3/uL — ABNORMAL LOW (ref 150–440)
RBC: 3.94 MIL/uL (ref 3.80–5.20)
RDW: 13.5 % (ref 11.5–14.5)
WBC: 17.3 10*3/uL — ABNORMAL HIGH (ref 3.6–11.0)

## 2017-12-24 MED ORDER — PRENATAL MULTIVITAMIN CH
1.0000 | ORAL_TABLET | Freq: Every day | ORAL | Status: DC
  Administered 2017-12-24 – 2017-12-25 (×2): 1 via ORAL
  Filled 2017-12-24 (×2): qty 1

## 2017-12-24 MED ORDER — DIPHENHYDRAMINE HCL 25 MG PO CAPS: 25.0000 mg | ORAL_CAPSULE | Freq: Four times a day (QID) | ORAL | Status: DC | PRN

## 2017-12-24 MED ORDER — SENNOSIDES-DOCUSATE SODIUM 8.6-50 MG PO TABS
2.0000 | ORAL_TABLET | ORAL | Status: DC
  Administered 2017-12-24 – 2017-12-25 (×2): 2 via ORAL
  Filled 2017-12-24 (×2): qty 2

## 2017-12-24 MED ORDER — COCONUT OIL OIL
1.0000 "application " | TOPICAL_OIL | Status: DC | PRN
  Filled 2017-12-24: qty 120

## 2017-12-24 MED ORDER — ZOLPIDEM TARTRATE 5 MG PO TABS: 5.0000 mg | ORAL_TABLET | Freq: Every evening | ORAL | Status: DC | PRN

## 2017-12-24 MED ORDER — ONDANSETRON HCL 4 MG/2ML IJ SOLN: 4.0000 mg | INTRAMUSCULAR | Status: DC | PRN

## 2017-12-24 MED ORDER — SIMETHICONE 80 MG PO CHEW: 80.0000 mg | CHEWABLE_TABLET | ORAL | Status: DC | PRN

## 2017-12-24 MED ORDER — BENZOCAINE-MENTHOL 20-0.5 % EX AERO
1.0000 | INHALATION_SPRAY | CUTANEOUS | Status: DC | PRN
Start: 2017-12-24 — End: 2017-12-25

## 2017-12-24 MED ORDER — ONDANSETRON HCL 4 MG PO TABS: 4.0000 mg | ORAL_TABLET | ORAL | Status: DC | PRN

## 2017-12-24 NOTE — Plan of Care (Signed)
Pt. Transferred to Room 346 PP. She is alert and oriented with cheerful affect. Color good, skin w&d. BBS clear. Fundus firm at U/E with moderate Lochia. Pt. Oriented to room, POC Adult and Infant Falls Policy. Pt and Husband V/O.

## 2017-12-24 NOTE — Progress Notes (Signed)
Post Partum Day 1 Subjective: no complaints, up ad lib and voiding  Objective: Blood pressure 103/60, pulse 60, temperature 97.7 F (36.5 C), temperature source Oral, resp. rate 20, height 5\' 1"  (1.549 m), weight 86.2 kg (190 lb), SpO2 98 %, unknown if currently breastfeeding.  Physical Exam:  General: alert, cooperative and appears stated age Lochia: appropriate Uterine Fundus: firm  Recent Labs    12/23/17 0941 12/24/17 0432  HGB 12.4 12.3  HCT 36.6 37.3    Assessment/Plan: Plan for discharge tomorrow   LOS: 1 day   Christeen DouglasBethany Katharina Jehle 12/24/2017, 10:30 PM

## 2017-12-25 LAB — CBC
HCT: 35 % (ref 35.0–47.0)
Hemoglobin: 11.6 g/dL — ABNORMAL LOW (ref 12.0–16.0)
MCH: 31.8 pg (ref 26.0–34.0)
MCHC: 33.2 g/dL (ref 32.0–36.0)
MCV: 95.7 fL (ref 80.0–100.0)
PLATELETS: 147 10*3/uL — AB (ref 150–440)
RBC: 3.66 MIL/uL — ABNORMAL LOW (ref 3.80–5.20)
RDW: 13.7 % (ref 11.5–14.5)
WBC: 10.1 10*3/uL (ref 3.6–11.0)

## 2017-12-25 NOTE — Progress Notes (Signed)
Patient discharged home with infant and significant other. Discharge instructions, prescriptions and follow up appointment given to and reviewed with patient and significant other. Patient verbalized understanding. Escorted out via wheelchair by auxiliary.  

## 2017-12-25 NOTE — Discharge Instructions (Signed)
Vaginal Delivery, Care After °Refer to this sheet in the next few weeks. These instructions provide you with information about caring for yourself after vaginal delivery. Your health care provider may also give you more specific instructions. Your treatment has been planned according to current medical practices, but problems sometimes occur. Call your health care provider if you have any problems or questions. °What can I expect after the procedure? °After vaginal delivery, it is common to have: °· Some bleeding from your vagina. °· Soreness in your abdomen, your vagina, and the area of skin between your vaginal opening and your anus (perineum). °· Pelvic cramps. °· Fatigue. ° °Follow these instructions at home: °Medicines °· Take over-the-counter and prescription medicines only as told by your health care provider. °· If you were prescribed an antibiotic medicine, take it as told by your health care provider. Do not stop taking the antibiotic until it is finished. °Driving ° °· Do not drive or operate heavy machinery while taking prescription pain medicine. °· Do not drive for 24 hours if you received a sedative. °Lifestyle °· Do not drink alcohol. This is especially important if you are breastfeeding or taking medicine to relieve pain. °· Do not use tobacco products, including cigarettes, chewing tobacco, or e-cigarettes. If you need help quitting, ask your health care provider. °Eating and drinking °· Drink at least 8 eight-ounce glasses of water every day unless you are told not to by your health care provider. If you choose to breastfeed your baby, you may need to drink more water than this. °· Eat high-fiber foods every day. These foods may help prevent or relieve constipation. High-fiber foods include: °? Whole grain cereals and breads. °? Brown rice. °? Beans. °? Fresh fruits and vegetables. °Activity °· Return to your normal activities as told by your health care provider. Ask your health care provider  what activities are safe for you. °· Rest as much as possible. Try to rest or take a nap when your baby is sleeping. °· Do not lift anything that is heavier than your baby or 10 lb (4.5 kg) until your health care provider says that it is safe. °· Talk with your health care provider about when you can engage in sexual activity. This may depend on your: °? Risk of infection. °? Rate of healing. °? Comfort and desire to engage in sexual activity. °Vaginal Care °· If you have an episiotomy or a vaginal tear, check the area every day for signs of infection. Check for: °? More redness, swelling, or pain. °? More fluid or blood. °? Warmth. °? Pus or a bad smell. °· Do not use tampons or douches until your health care provider says this is safe. °· Watch for any blood clots that may pass from your vagina. These may look like clumps of dark red, brown, or black discharge. °General instructions °· Keep your perineum clean and dry as told by your health care provider. °· Wear loose, comfortable clothing. °· Wipe from front to back when you use the toilet. °· Ask your health care provider if you can shower or take a bath. If you had an episiotomy or a perineal tear during labor and delivery, your health care provider may tell you not to take baths for a certain length of time. °· Wear a bra that supports your breasts and fits you well. °· If possible, have someone help you with household activities and help care for your baby for at least a few days after   you leave the hospital. °· Keep all follow-up visits for you and your baby as told by your health care provider. This is important. °Contact a health care provider if: °· You have: °? Vaginal discharge that has a bad smell. °? Difficulty urinating. °? Pain when urinating. °? A sudden increase or decrease in the frequency of your bowel movements. °? More redness, swelling, or pain around your episiotomy or vaginal tear. °? More fluid or blood coming from your episiotomy or  vaginal tear. °? Pus or a bad smell coming from your episiotomy or vaginal tear. °? A fever. °? A rash. °? Little or no interest in activities you used to enjoy. °? Questions about caring for yourself or your baby. °· Your episiotomy or vaginal tear feels warm to the touch. °· Your episiotomy or vaginal tear is separating or does not appear to be healing. °· Your breasts are painful, hard, or turn red. °· You feel unusually sad or worried. °· You feel nauseous or you vomit. °· You pass large blood clots from your vagina. If you pass a blood clot from your vagina, save it to show to your health care provider. Do not flush blood clots down the toilet without having your health care provider look at them. °· You urinate more than usual. °· You are dizzy or light-headed. °· You have not breastfed at all and you have not had a menstrual period for 12 weeks after delivery. °· You have stopped breastfeeding and you have not had a menstrual period for 12 weeks after you stopped breastfeeding. °Get help right away if: °· You have: °? Pain that does not go away or does not get better with medicine. °? Chest pain. °? Difficulty breathing. °? Blurred vision or spots in your vision. °? Thoughts about hurting yourself or your baby. °· You develop pain in your abdomen or in one of your legs. °· You develop a severe headache. °· You faint. °· You bleed from your vagina so much that you fill two sanitary pads in one hour. °This information is not intended to replace advice given to you by your health care provider. Make sure you discuss any questions you have with your health care provider. °Document Released: 09/15/2000 Document Revised: 03/01/2016 Document Reviewed: 10/03/2015 °Elsevier Interactive Patient Education © 2018 [employer] ° ° °Breastfeeding °Choosing to breastfeed is one of the best decisions you can make for yourself and your baby. A change in hormones during pregnancy causes your breasts to make breast milk in  your milk-producing glands. Hormones prevent breast milk from being released before your baby is born. They also prompt milk flow after birth. Once breastfeeding has begun, thoughts of your baby, as well as his or her sucking or crying, can stimulate the release of milk from your milk-producing glands. °Benefits of breastfeeding °Research shows that breastfeeding offers many health benefits for infants and mothers. It also offers a cost-free and convenient way to feed your baby. °For your baby °· Your first milk (colostrum) helps your baby's digestive system to function better. °· Special cells in your milk (antibodies) help your baby to fight off infections. °· Breastfed babies are less likely to develop asthma, allergies, obesity, or type 2 diabetes. They are also at lower risk for sudden infant death syndrome (SIDS). °· Nutrients in breast milk are better able to meet your baby’s needs compared to infant formula. °· Breast milk improves your baby's brain development. °For you °· Breastfeeding helps to create a   very special bond between you and your baby. °· Breastfeeding is convenient. Breast milk costs nothing and is always available at the correct temperature. °· Breastfeeding helps to burn calories. It helps you to lose the weight that you gained during pregnancy. °· Breastfeeding makes your uterus return faster to its size before pregnancy. It also slows bleeding (lochia) after you give birth. °· Breastfeeding helps to lower your risk of developing type 2 diabetes, osteoporosis, rheumatoid arthritis, cardiovascular disease, and breast, ovarian, uterine, and endometrial cancer later in life. °Breastfeeding basics °Starting breastfeeding °· Find a comfortable place to sit or lie down, with your neck and back well-supported. °· Place a pillow or a rolled-up blanket under your baby to bring him or her to the level of your breast (if you are seated). Nursing pillows are specially designed to help support your arms  and your baby while you breastfeed. °· Make sure that your baby's tummy (abdomen) is facing your abdomen. °· Gently massage your breast. With your fingertips, massage from the outer edges of your breast inward toward the nipple. This encourages milk flow. If your milk flows slowly, you may need to continue this action during the feeding. °· Support your breast with 4 fingers underneath and your thumb above your nipple (make the letter "C" with your hand). Make sure your fingers are well away from your nipple and your baby’s mouth. °· Stroke your baby's lips gently with your finger or nipple. °· When your baby's mouth is open wide enough, quickly bring your baby to your breast, placing your entire nipple and as much of the areola as possible into your baby's mouth. The areola is the colored area around your nipple. °? More areola should be visible above your baby's upper lip than below the lower lip. °? Your baby's lips should be opened and extended outward (flanged) to ensure an adequate, comfortable latch. °? Your baby's tongue should be between his or her lower gum and your breast. °· Make sure that your baby's mouth is correctly positioned around your nipple (latched). Your baby's lips should create a seal on your breast and be turned out (everted). °· It is common for your baby to suck about 2-3 minutes in order to start the flow of breast milk. °Latching °Teaching your baby how to latch onto your breast properly is very important. An improper latch can cause nipple pain, decreased milk supply, and poor weight gain in your baby. Also, if your baby is not latched onto your nipple properly, he or she may swallow some air during feeding. This can make your baby fussy. Burping your baby when you switch breasts during the feeding can help to get rid of the air. However, teaching your baby to latch on properly is still the best way to prevent fussiness from swallowing air while breastfeeding. °Signs that your baby has  successfully latched onto your nipple °· Silent tugging or silent sucking, without causing you pain. Infant's lips should be extended outward (flanged). °· Swallowing heard between every 3-4 sucks once your milk has started to flow (after your let-down milk reflex occurs). °· Muscle movement above and in front of his or her ears while sucking. ° °Signs that your baby has not successfully latched onto your nipple °· Sucking sounds or smacking sounds from your baby while breastfeeding. °· Nipple pain. ° °If you think your baby has not latched on correctly, slip your finger into the corner of your baby’s mouth to break the suction and place   it between your baby's gums. Attempt to start breastfeeding again. °Signs of successful breastfeeding °Signs from your baby °· Your baby will gradually decrease the number of sucks or will completely stop sucking. °· Your baby will fall asleep. °· Your baby's body will relax. °· Your baby will retain a small amount of milk in his or her mouth. °· Your baby will let go of your breast by himself or herself. ° °Signs from you °· Breasts that have increased in firmness, weight, and size 1-3 hours after feeding. °· Breasts that are softer immediately after breastfeeding. °· Increased milk volume, as well as a change in milk consistency and color by the fifth day of breastfeeding. °· Nipples that are not sore, cracked, or bleeding. ° °Signs that your baby is getting enough milk °· Wetting at least 1-2 diapers during the first 24 hours after birth. °· Wetting at least 5-6 diapers every 24 hours for the first week after birth. The urine should be clear or pale yellow by the age of 5 days. °· Wetting 6-8 diapers every 24 hours as your baby continues to grow and develop. °· At least 3 stools in a 24-hour period by the age of 5 days. The stool should be soft and yellow. °· At least 3 stools in a 24-hour period by the age of 7 days. The stool should be seedy and yellow. °· No loss of weight  greater than 10% of birth weight during the first 3 days of life. °· Average weight gain of 4-7 oz (113-198 g) per week after the age of 4 days. °· Consistent daily weight gain by the age of 5 days, without weight loss after the age of 2 weeks. °After a feeding, your baby may spit up a small amount of milk. This is normal. °Breastfeeding frequency and duration °Frequent feeding will help you make more milk and can prevent sore nipples and extremely full breasts (breast engorgement). Breastfeed when you feel the need to reduce the fullness of your breasts or when your baby shows signs of hunger. This is called "breastfeeding on demand." Signs that your baby is hungry include: °· Increased alertness, activity, or restlessness. °· Movement of the head from side to side. °· Opening of the mouth when the corner of the mouth or cheek is stroked (rooting). °· Increased sucking sounds, smacking lips, cooing, sighing, or squeaking. °· Hand-to-mouth movements and sucking on fingers or hands. °· Fussing or crying. ° °Avoid introducing a pacifier to your baby in the first 4-6 weeks after your baby is born. After this time, you may choose to use a pacifier. Research has shown that pacifier use during the first year of a baby's life decreases the risk of sudden infant death syndrome (SIDS). °Allow your baby to feed on each breast as long as he or she wants. When your baby unlatches or falls asleep while feeding from the first breast, offer the second breast. Because newborns are often sleepy in the first few weeks of life, you may need to awaken your baby to get him or her to feed. °Breastfeeding times will vary from baby to baby. However, the following rules can serve as a guide to help you make sure that your baby is properly fed: °· Newborns (babies 4 weeks of age or younger) may breastfeed every 1-3 hours. °· Newborns should not go without breastfeeding for longer than 3 hours during the day or 5 hours during the  night. °· You should breastfeed your baby   a minimum of 8 times in a 24-hour period. ° °Breast milk pumping °Pumping and storing breast milk allows you to make sure that your baby is exclusively fed your breast milk, even at times when you are unable to breastfeed. This is especially important if you go back to work while you are still breastfeeding, or if you are not able to be present during feedings. Your lactation consultant can help you find a method of pumping that works best for you and give you guidelines about how long it is safe to store breast milk. °Caring for your breasts while you breastfeed °Nipples can become dry, cracked, and sore while breastfeeding. The following recommendations can help keep your breasts moisturized and healthy: °· Avoid using soap on your nipples. °· Wear a supportive bra designed especially for nursing. Avoid wearing underwire-style bras or extremely tight bras (sports bras). °· Air-dry your nipples for 3-4 minutes after each feeding. °· Use only cotton bra pads to absorb leaked breast milk. Leaking of breast milk between feedings is normal. °· Use lanolin on your nipples after breastfeeding. Lanolin helps to maintain your skin's normal moisture barrier. Pure lanolin is not harmful (not toxic) to your baby. You may also hand express a few drops of breast milk and gently massage that milk into your nipples and allow the milk to air-dry. ° °In the first few weeks after giving birth, some women experience breast engorgement. Engorgement can make your breasts feel heavy, warm, and tender to the touch. Engorgement peaks within 3-5 days after you give birth. The following recommendations can help to ease engorgement: °· Completely empty your breasts while breastfeeding or pumping. You may want to start by applying warm, moist heat (in the shower or with warm, water-soaked hand towels) just before feeding or pumping. This increases circulation and helps the milk flow. If your baby does  not completely empty your breasts while breastfeeding, pump any extra milk after he or she is finished. °· Apply ice packs to your breasts immediately after breastfeeding or pumping, unless this is too uncomfortable for you. To do this: °? Put ice in a plastic bag. °? Place a towel between your skin and the bag. °? Leave the ice on for 20 minutes, 2-3 times a day. °· Make sure that your baby is latched on and positioned properly while breastfeeding. ° °If engorgement persists after 48 hours of following these recommendations, contact your health care provider or a lactation consultant. °Overall health care recommendations while breastfeeding °· Eat 3 healthy meals and 3 snacks every day. Well-nourished mothers who are breastfeeding need an additional 450-500 calories a day. You can meet this requirement by increasing the amount of a balanced diet that you eat. °· Drink enough water to keep your urine pale yellow or clear. °· Rest often, relax, and continue to take your prenatal vitamins to prevent fatigue, stress, and low vitamin and mineral levels in your body (nutrient deficiencies). °· Do not use any products that contain nicotine or tobacco, such as cigarettes and e-cigarettes. Your baby may be harmed by chemicals from cigarettes that pass into breast milk and exposure to secondhand smoke. If you need help quitting, ask your health care provider. °· Avoid alcohol. °· Do not use illegal drugs or marijuana. °· Talk with your health care provider before taking any medicines. These include over-the-counter and prescription medicines as well as vitamins and herbal supplements. Some medicines that may be harmful to your baby can pass through breast milk. °·   It is possible to become pregnant while breastfeeding. If birth control is desired, ask your health care provider about options that will be safe while breastfeeding your baby. °Where to find more information: °[employer]: www.llli.org °Contact a  health care provider if: °· You feel like you want to stop breastfeeding or have become frustrated with breastfeeding. °· Your nipples are cracked or bleeding. °· Your breasts are red, tender, or warm. °· You have: °? Painful breasts or nipples. °? A swollen area on either breast. °? A fever or chills. °? Nausea or vomiting. °? Drainage other than breast milk from your nipples. °· Your breasts do not become full before feedings by the fifth day after you give birth. °· You feel sad and depressed. °· Your baby is: °? Too sleepy to eat well. °? Having trouble sleeping. °? More than 1 week old and wetting fewer than 6 diapers in a 24-hour period. °? Not gaining weight by 5 days of age. °· Your baby has fewer than 3 stools in a 24-hour period. °· Your baby's skin or the white parts of his or her eyes become yellow. °Get help right away if: °· Your baby is overly tired (lethargic) and does not want to wake up and feed. °· Your baby develops an unexplained fever. °Summary °· Breastfeeding offers many health benefits for infant and mothers. °· Try to breastfeed your infant when he or she shows early signs of hunger. °· Gently tickle or stroke your baby's lips with your finger or nipple to allow the baby to open his or her mouth. Bring the baby to your breast. Make sure that much of the areola is in your baby's mouth. Offer one side and burp the baby before you offer the other side. °· Talk with your health care provider or lactation consultant if you have questions or you face problems as you breastfeed. °This information is not intended to replace advice given to you by your health care provider. Make sure you discuss any questions you have with your health care provider. °Document Released: 09/18/2005 Document Revised: 10/20/2016 Document Reviewed: 10/20/2016 °Elsevier Interactive Patient Education © 2018 [employer] ° °

## 2017-12-25 NOTE — Plan of Care (Signed)
Vs stable; up ad lib; regular diet; breastfeeding (just a little assistance needed); taking only motrin for pain control; probable discharge today

## 2019-11-24 ENCOUNTER — Ambulatory Visit: Payer: 59 | Attending: Internal Medicine

## 2019-11-24 DIAGNOSIS — Z23 Encounter for immunization: Secondary | ICD-10-CM | POA: Insufficient documentation

## 2019-11-24 NOTE — Progress Notes (Signed)
   Covid-19 Vaccination Clinic  Name:  Janice Randall    MRN: [id number] DOB: 1986-07-13  11/24/2019  [name] was observed post Covid-19 immunization for 30 minutes based on pre-vaccination screening without incidence. She was provided with Vaccine Information Sheet and instruction to access the V-Safe system.   [name] was instructed to call 911 with any severe reactions post vaccine: Marland Kitchen Difficulty breathing  . Swelling of your face and throat  . A fast heartbeat  . A bad rash all over your body  . Dizziness and weakness    Immunizations Administered    Name Date Dose VIS Date Route   Moderna COVID-19 Vaccine 11/24/2019  1:51 PM 0.5 mL 09/02/2019 Intramuscular   Manufacturer: Moderna   Lot: [id number]   NDC: 72902-111-55

## 2019-12-23 ENCOUNTER — Ambulatory Visit: Payer: 59 | Attending: Internal Medicine

## 2019-12-23 DIAGNOSIS — Z23 Encounter for immunization: Secondary | ICD-10-CM

## 2019-12-23 NOTE — Progress Notes (Signed)
   Covid-19 Vaccination Clinic  Name:  Janice Randall    MRN: [id number] DOB: 05/04/1986  12/23/2019  [name] was observed post Covid-19 immunization for 30 minutes based on pre-vaccination screening without incident. She was provided with Vaccine Information Sheet and instruction to access the V-Safe system.   [name] was instructed to call 911 with any severe reactions post vaccine: Marland Kitchen Difficulty breathing  . Swelling of face and throat  . A fast heartbeat  . A bad rash all over body  . Dizziness and weakness   Immunizations Administered    Name Date Dose VIS Date Route   Moderna COVID-19 Vaccine 12/23/2019  1:46 PM 0.5 mL 09/02/2019 Intramuscular   Manufacturer: Gala Murdoch   Lot: [id number]   NDC: 44830-159-96

## 2020-09-13 ENCOUNTER — Ambulatory Visit: Payer: 59 | Attending: Internal Medicine

## 2020-09-13 DIAGNOSIS — Z23 Encounter for immunization: Secondary | ICD-10-CM

## 2020-09-13 NOTE — Progress Notes (Signed)
   Covid-19 Vaccination Clinic  Name:  Janice Randall    MRN: [id number] DOB: [birth year]  09/13/2020  [name] was observed post Covid-19 immunization for 30 minutes based on pre-vaccination screening without incident. She was provided with Vaccine Information Sheet and instruction to access the V-Safe system.   [name] was instructed to call 911 with any severe reactions post vaccine: Marland Kitchen Difficulty breathing  . Swelling of face and throat  . A fast heartbeat  . A bad rash all over body  . Dizziness and weakness   Immunizations Administered    No immunizations on file.

## 2021-10-17 ENCOUNTER — Ambulatory Visit
Admission: EM | Admit: 2021-10-17 | Discharge: 2021-10-17 | Disposition: A | Discharge status: Home / Self Care | Payer: BC Managed Care – PPO | Attending: Emergency Medicine | Admitting: Emergency Medicine

## 2021-10-17 ENCOUNTER — Other Ambulatory Visit: Payer: Self-pay

## 2021-10-17 DIAGNOSIS — J0100 Acute maxillary sinusitis, unspecified: Secondary | ICD-10-CM | POA: Diagnosis not present

## 2021-10-17 MED ORDER — AMOXICILLIN-POT CLAVULANATE 875-125 MG PO TABS: 1.0000 | ORAL_TABLET | Freq: Two times a day (BID) | ORAL | 0 refills | Status: DC

## 2021-10-17 MED ORDER — GUAIFENESIN ER 600 MG PO TB12: 1200.0000 mg | ORAL_TABLET | Freq: Two times a day (BID) | ORAL | 1 refills | Status: DC

## 2021-10-17 MED ORDER — FLUTICASONE PROPIONATE 50 MCG/ACT NA SUSP: 1.0000 | Freq: Every day | NASAL | 0 refills | Status: DC

## 2021-10-17 NOTE — ED Triage Notes (Signed)
Pt with "sinus issues" starting Friday evening on right side of face and ear. Nasal congestion with blowing green secretions from her nose.

## 2021-10-17 NOTE — ED Provider Notes (Signed)
[facility]    CSN: [id number] Arrival date & time: 10/17/21  1120      History   Chief Complaint Chief Complaint  Patient presents with   Facial Pain    HPI Manju Kulkarni is a 36 y.o. female.   Patient presents with right-sided nasal congestion, ear pain, sinus pain and pressure and a generalized headache for 4 days.  No known sick contacts.  Tolerating food and liquids.  Has attempted use of DayQuil which was not effective.  History of anemia and anxiety.  Denies fever, chills, coughing, shortness of breath, wheezing, abdominal pain, nausea, vomiting, diarrhea.  Past Medical History:  Diagnosis Date   Anemia    Anxiety    Complication of anesthesia     Patient Active Problem List   Diagnosis Date Noted   Labor and delivery, indication for care 12/23/2017   Labor with prolonged first stage, antepartum 12/17/2017   Single umbilical artery affecting management of mother, antepartum, single gestation    Supervision of high risk pregnancy in third trimester 07/03/2017    Past Surgical History:  Procedure Laterality Date   NO PAST SURGERIES      OB History     Gravida  2   Para  2   Term  2   Preterm      AB      Living  2      SAB      IAB      Ectopic      Multiple  0   Live Births  1            Home Medications    Prior to Admission medications   Medication Sig Start Date End Date Taking? Authorizing Provider  ferrous sulfate 325 (65 FE) MG EC tablet Take 325 mg by mouth 3 (three) times daily with meals.    [provider]  Prenatal Vit-Fe Fumarate-FA (PRENATAL MULTIVITAMIN) TABS tablet Take 1 tablet by mouth daily at 12 noon.    [provider]    Family History History reviewed. No pertinent family history.  Social History Social History   Tobacco Use   Smoking status: Never    Passive exposure: Never   Smokeless tobacco: Never  Vaping Use   Vaping Use: Never used  Substance Use Topics   Alcohol  use: No   Drug use: No     Allergies   Justicia adhatoda (malabar nut tree) [justicia adhatoda], Lexapro [escitalopram oxalate], Codeine, and Flagyl [metronidazole]   Review of Systems Review of Systems  HENT:  Positive for congestion, ear pain, sinus pressure and sinus pain. Negative for dental problem, drooling, ear discharge, facial swelling, hearing loss, mouth sores, nosebleeds, postnasal drip, rhinorrhea, sneezing, sore throat, tinnitus, trouble swallowing and voice change.   Respiratory: Negative.    Cardiovascular: Negative.   Gastrointestinal: Negative.   Skin: Negative.   Neurological:  Positive for headaches. Negative for dizziness, tremors, seizures, syncope, facial asymmetry, speech difficulty, weakness, light-headedness and numbness.    Physical Exam Triage Vital Signs ED Triage Vitals  Enc Vitals Group     BP 10/17/21 1133 122/81     Pulse Rate 10/17/21 1133 (!) 59     Resp 10/17/21 1133 15     Temp 10/17/21 1133 98.3 F (36.8 C)     Temp Source 10/17/21 1133 Oral     SpO2 10/17/21 1133 100 %     Weight 10/17/21 1130 150 lb (68 kg)  Height 10/17/21 1130 5' (1.524 m)     Head Circumference --      Peak Flow --      Pain Score 10/17/21 1130 3     Pain Loc --      Pain Edu? --      Excl. in GC? --    No data found.  Updated Vital Signs BP 122/81 (BP Location: Right Arm)    Pulse (!) 59    Temp 98.3 F (36.8 C) (Oral)    Resp 15    Ht 5' (1.524 m)    Wt 150 lb (68 kg)    LMP 10/13/2021    SpO2 100%    BMI 29.29 kg/m   Visual Acuity Right Eye Distance:   Left Eye Distance:   Bilateral Distance:    Right Eye Near:   Left Eye Near:    Bilateral Near:     Physical Exam Constitutional:      Appearance: Normal appearance.  HENT:     Head: Normocephalic.     Right Ear: Tympanic membrane, ear canal and external ear normal.     Left Ear: Tympanic membrane, ear canal and external ear normal.     Nose:     Right Turbinates: Swollen.     Left  Turbinates: Swollen.     Right Sinus: Maxillary sinus tenderness and frontal sinus tenderness present.     Left Sinus: No maxillary sinus tenderness or frontal sinus tenderness.     Mouth/Throat:     Mouth: Mucous membranes are moist.     Pharynx: Oropharynx is clear.  Eyes:     Extraocular Movements: Extraocular movements intact.  Cardiovascular:     Rate and Rhythm: Normal rate and regular rhythm.     Pulses: Normal pulses.     Heart sounds: Normal heart sounds.  Pulmonary:     Effort: Pulmonary effort is normal.     Breath sounds: Normal breath sounds.  Musculoskeletal:     Cervical back: Normal range of motion.  Lymphadenopathy:     Cervical: Cervical adenopathy present.  Skin:    General: Skin is warm and dry.  Neurological:     Mental Status: She is alert and oriented to person, place, and time. Mental status is at baseline.  Psychiatric:        Mood and Affect: Mood normal.        Behavior: Behavior normal.     UC Treatments / Results  Labs (all labs ordered are listed, but only abnormal results are displayed) Labs Reviewed - No data to display  EKG   Radiology No results found.  Procedures Procedures (including critical care time)  Medications Ordered in UC Medications - No data to display  Initial Impression / Assessment and Plan / UC Course  I have reviewed the triage vital signs and the nursing notes.  Pertinent labs & imaging results that were available during my care of the patient were reviewed by me and considered in my medical decision making (see chart for details).  Acute nonrecurrent maxillary sinusitis  Discussed etiology of symptoms most likely being viral, will manage conservatively, prescribed Mucinex and Flonase for management, advised saline irrigation, increasing fluid intake in addition, watchful wait antibiotic placed at pharmacy for day 10 if symptoms have not improved, prescribed Augmentin 7-day course, patient in agreement with plan  of care will follow-up as needed Final Clinical Impressions(s) / UC Diagnoses   Final diagnoses:  None   Discharge Instructions  None    ED Prescriptions   None    PDMP not reviewed this encounter.   Valinda Hoar, NP 10/17/21 [phone]

## 2021-10-17 NOTE — Discharge Instructions (Signed)
Today you are being treated for a sinus infection, majority of sinus infections are caused by viruses and will typically clear up on their own however will make exceed past 10 days and worsen we are able to trial antibiotics to see if that will help give relief, on Friday if your symptoms are still present and have not improved you may start Augmentin twice a day for the next 7 days  Perform sinus irrigation 2-3 times a day with a [employer]Med sinus rinse kit and distilled water.  Do not use tap water.  You Mucinex 2 tablets every morning and evening to break up the stickiness of the mucus so your body can clear it.  Increase your oral fluid intake to thin out your mucus so that is also able for your body to clear more easily.  Use Flonase nasal spray every morning to help clear sinus passages  Use the Phenylphrine nasal spray, 2 squirts up each nostril every 6 hours, as needed for nasal and sinus congestion.   If you develop any new or worsening symptoms return for reevaluation or see your primary care provider.

## 2022-03-27 DIAGNOSIS — Z3483 Encounter for supervision of other normal pregnancy, third trimester: Secondary | ICD-10-CM | POA: Insufficient documentation

## 2022-04-23 LAB — OB RESULTS CONSOLE RPR: RPR: NONREACTIVE

## 2022-04-28 LAB — OB RESULTS CONSOLE VARICELLA ZOSTER ANTIBODY, IGG: Varicella: IMMUNE

## 2022-04-28 LAB — OB RESULTS CONSOLE RUBELLA ANTIBODY, IGM: Rubella: IMMUNE

## 2022-04-28 LAB — OB RESULTS CONSOLE HEPATITIS B SURFACE ANTIGEN: Hepatitis B Surface Ag: NEGATIVE

## 2022-07-12 ENCOUNTER — Observation Stay
Admission: EM | Admit: 2022-07-12 | Discharge: 2022-07-12 | Disposition: A | Discharge status: Home / Self Care | Payer: Worker's Compensation | Attending: Certified Nurse Midwife | Admitting: Certified Nurse Midwife

## 2022-07-12 ENCOUNTER — Other Ambulatory Visit: Payer: Self-pay

## 2022-07-12 ENCOUNTER — Encounter: Payer: Self-pay | Admitting: Obstetrics and Gynecology

## 2022-07-12 DIAGNOSIS — E669 Obesity, unspecified: Secondary | ICD-10-CM | POA: Insufficient documentation

## 2022-07-12 DIAGNOSIS — W19XXXA Unspecified fall, initial encounter: Secondary | ICD-10-CM | POA: Diagnosis present

## 2022-07-12 DIAGNOSIS — Z6832 Body mass index (BMI) 32.0-32.9, adult: Secondary | ICD-10-CM | POA: Insufficient documentation

## 2022-07-12 DIAGNOSIS — O3662X0 Maternal care for excessive fetal growth, second trimester, not applicable or unspecified: Secondary | ICD-10-CM | POA: Insufficient documentation

## 2022-07-12 DIAGNOSIS — O368320 Maternal care for abnormalities of the fetal heart rate or rhythm, second trimester, not applicable or unspecified: Principal | ICD-10-CM | POA: Insufficient documentation

## 2022-07-12 DIAGNOSIS — O09522 Supervision of elderly multigravida, second trimester: Secondary | ICD-10-CM | POA: Insufficient documentation

## 2022-07-12 DIAGNOSIS — O99212 Obesity complicating pregnancy, second trimester: Secondary | ICD-10-CM | POA: Insufficient documentation

## 2022-07-12 DIAGNOSIS — Z3A37 37 weeks gestation of pregnancy: Secondary | ICD-10-CM | POA: Insufficient documentation

## 2022-07-12 NOTE — Discharge Summary (Signed)
Patient ID: Janice Randall MRN: [id number] DOB/AGE: 05/02/1986 36 y.o.  Admit date: 07/12/2022 Discharge date: 07/12/2022  Admission Diagnoses: 36 yo G3P2 at [redacted]w[redacted]d presents to triage after being hit in the belly with a basketball around 1300  Pt reported spotting and cramping afterwards.  Discharge Diagnoses: GFM and reassuring fetal monitoring  Factors complicating pregnancy: Advanced maternal Age, age 36yo at EDD BMI 32.08 History of 2V umbilical cord with G2   Prenatal Procedures: 4 hours of fetal monitoring   Consults: None  Significant Diagnostic Studies:  No results found for this or any previous visit (from the past 168 hour(s)).  Treatments: none  Hospital Course:  Pt was observed for 4 hours  FHR baseline: 140 bpm Variability: moderate Accelerations: yes Decelerations: none TOCO: quiet SVE: deferred   She was observed, no vaginal bleeding or cramping, fetal heart rate monitoring remained reassuring, and she had no signs/symptoms of labor or other maternal-fetal concerns.   She was deemed stable for discharge to home with outpatient follow up.  Discharge Physical Exam:  BP 112/62 (BP Location: Left Arm)   Pulse 73   Temp 98.4 F (36.9 C) (Oral)   Resp 16   Ht 5' (1.524 m)   Wt 78.5 kg   LMP 01/22/2022   BMI 33.79 kg/m   General: NAD CV: RRR Pulm: CTABL, nl effort ABD: s/nd/nt, gravid DVT Evaluation: LE non-ttp, no evidence of DVT on exam.   Discharge Condition: Stable  Disposition: Discharge disposition: 01-Home or Self Care        Allergies as of 07/12/2022       Reactions   Justicia Adhatoda (malabar Nut Tree) [justicia Adhatoda] Swelling   Lexapro [escitalopram Oxalate] Shortness Of Breath, Other (See Comments)   Difficulty breathing   Codeine Rash   Flagyl [metronidazole] Rash        Medication List     STOP taking these medications    amoxicillin-clavulanate 875-125 MG tablet Commonly known as: AUGMENTIN   fluticasone 50  MCG/ACT nasal spray Commonly known as: FLONASE   guaiFENesin 600 MG 12 hr tablet Commonly known as: Mucinex        Follow-up [facility] Follow up.   Why: Keep all scheduled appointments Contact information: [place] [phone]                Signed:  Regina Eck 07/12/2022 8:12 PM

## 2022-07-12 NOTE — OB Triage Note (Signed)
Janice Randall Kitten 36 y.o. presents to Labor & Delivery triage via wheelchair steered by ED staff. She is a G2P1001 at Unknown . She denies signs and symptoms consistent with rupture of membranes or active vaginal bleeding. She denies contractions and states positive fetal movement. External FM and TOCO applied to non-tender abdomen. Initial FHR 155. Vital signs obtained and within normal limits. Patient oriented to care environment including call bell and bed control use. Lonie Peak, CNM notified of patient's arrival.

## 2022-10-02 NOTE — L&D Delivery Note (Signed)
Delivery Note  First Stage: Labor onset: 0300 Augmentation: Pitocin and AROM Analgesia /Anesthesia intrapartum: IVPM Fentanyl x 1 dose.  AROM at 0857  Second Stage: Complete dilation at 1401 Onset of pushing at 1401 FHR second stage Cat II- variables  Delivery of a viable female infant on 10/23/22 at 1411 by CNM delivery of fetal head in ROA position with restitution to ROT. No nuchal cord;  Anterior then posterior shoulders delivered easily with gentle downward traction. Baby placed on mom's chest, and attended to by peds.  Cord double clamped after cessation of pulsation, cut by FOB Cord blood sample collected     Third Stage: Placenta delivered spontaneously intact with 3VC @ 1413 Placenta disposition: routine disposal Uterine tone Firm / bleeding small  1st deg perineal laceration identified  Anesthesia for repair: n/a Repair not indicated, abrasion hemostatic Est. Blood Loss (mL): 585  Complications: none  Mom to postpartum.  Baby to Couplet care / Skin to Skin.  Newborn: Birth Weight: 7#7  Apgar Scores: 8/9 Feeding planned: breast

## 2022-10-05 LAB — OB RESULTS CONSOLE GC/CHLAMYDIA
Chlamydia: NEGATIVE
Neisseria Gonorrhea: NEGATIVE

## 2022-10-05 LAB — OB RESULTS CONSOLE HIV ANTIBODY (ROUTINE TESTING): HIV: NONREACTIVE

## 2022-10-05 LAB — OB RESULTS CONSOLE GBS: GBS: NEGATIVE

## 2022-10-23 ENCOUNTER — Other Ambulatory Visit: Payer: Self-pay

## 2022-10-23 ENCOUNTER — Encounter: Payer: Self-pay | Admitting: Obstetrics and Gynecology

## 2022-10-23 ENCOUNTER — Inpatient Hospital Stay
Admission: EM | Admit: 2022-10-23 | Discharge: 2022-10-24 | DRG: 807 | Disposition: A | Discharge status: Home / Self Care | Payer: BC Managed Care – PPO | Attending: Obstetrics | Admitting: Obstetrics

## 2022-10-23 DIAGNOSIS — Z7982 Long term (current) use of aspirin: Secondary | ICD-10-CM | POA: Diagnosis not present

## 2022-10-23 DIAGNOSIS — O99214 Obesity complicating childbirth: Principal | ICD-10-CM | POA: Diagnosis present

## 2022-10-23 DIAGNOSIS — Z3A38 38 weeks gestation of pregnancy: Secondary | ICD-10-CM | POA: Diagnosis not present

## 2022-10-23 DIAGNOSIS — O26893 Other specified pregnancy related conditions, third trimester: Secondary | ICD-10-CM | POA: Diagnosis present

## 2022-10-23 LAB — TYPE AND SCREEN
ABO/RH(D): O POS
Antibody Screen: NEGATIVE

## 2022-10-23 LAB — CBC
HCT: 35.2 % — ABNORMAL LOW (ref 36.0–46.0)
Hemoglobin: 11.8 g/dL — ABNORMAL LOW (ref 12.0–15.0)
MCH: 31.6 pg (ref 26.0–34.0)
MCHC: 33.5 g/dL (ref 30.0–36.0)
MCV: 94.4 fL (ref 80.0–100.0)
Platelets: 135 10*3/uL — ABNORMAL LOW (ref 150–400)
RBC: 3.73 MIL/uL — ABNORMAL LOW (ref 3.87–5.11)
RDW: 13.7 % (ref 11.5–15.5)
WBC: 13.3 10*3/uL — ABNORMAL HIGH (ref 4.0–10.5)
nRBC: 0 % (ref 0.0–0.2)

## 2022-10-23 LAB — RUPTURE OF MEMBRANE (ROM)PLUS: Rom Plus: NEGATIVE

## 2022-10-23 LAB — RPR: RPR Ser Ql: NONREACTIVE

## 2022-10-23 MED ORDER — IBUPROFEN 600 MG PO TABS
600.0000 mg | ORAL_TABLET | Freq: Four times a day (QID) | ORAL | Status: DC
  Administered 2022-10-23 – 2022-10-24 (×5): 600 mg via ORAL
  Filled 2022-10-23 (×5): qty 1

## 2022-10-23 MED ORDER — PRENATAL MULTIVITAMIN CH
1.0000 | ORAL_TABLET | Freq: Every day | ORAL | Status: DC
  Administered 2022-10-24: 1 via ORAL
  Filled 2022-10-23: qty 1

## 2022-10-23 MED ORDER — ZOLPIDEM TARTRATE 5 MG PO TABS: 5.0000 mg | ORAL_TABLET | Freq: Every evening | ORAL | Status: DC | PRN

## 2022-10-23 MED ORDER — WITCH HAZEL-GLYCERIN EX PADS
1.0000 | MEDICATED_PAD | CUTANEOUS | Status: DC | PRN
  Filled 2022-10-23: qty 100

## 2022-10-23 MED ORDER — ONDANSETRON HCL 4 MG/2ML IJ SOLN: 4.0000 mg | Freq: Four times a day (QID) | INTRAMUSCULAR | Status: DC | PRN

## 2022-10-23 MED ORDER — ONDANSETRON HCL 4 MG/2ML IJ SOLN: 4.0000 mg | INTRAMUSCULAR | Status: DC | PRN

## 2022-10-23 MED ORDER — MISOPROSTOL 200 MCG PO TABS
ORAL_TABLET | ORAL | Status: AC
  Filled 2022-10-23: qty 3

## 2022-10-23 MED ORDER — LIDOCAINE HCL (PF) 1 % IJ SOLN
INTRAMUSCULAR | Status: AC
  Filled 2022-10-23: qty 30

## 2022-10-23 MED ORDER — LACTATED RINGERS IV SOLN: INTRAVENOUS | Status: DC

## 2022-10-23 MED ORDER — SIMETHICONE 80 MG PO CHEW: 80.0000 mg | CHEWABLE_TABLET | ORAL | Status: DC | PRN

## 2022-10-23 MED ORDER — DIPHENHYDRAMINE HCL 25 MG PO CAPS: 25.0000 mg | ORAL_CAPSULE | Freq: Four times a day (QID) | ORAL | Status: DC | PRN

## 2022-10-23 MED ORDER — DIBUCAINE (PERIANAL) 1 % EX OINT
1.0000 | TOPICAL_OINTMENT | CUTANEOUS | Status: DC | PRN
  Filled 2022-10-23: qty 28

## 2022-10-23 MED ORDER — COCONUT OIL OIL: 1.0000 | TOPICAL_OIL | Status: DC | PRN

## 2022-10-23 MED ORDER — FENTANYL CITRATE (PF) 100 MCG/2ML IJ SOLN
50.0000 ug | INTRAMUSCULAR | Status: DC | PRN
  Administered 2022-10-23: 50 ug via INTRAVENOUS
  Filled 2022-10-23: qty 2

## 2022-10-23 MED ORDER — OXYTOCIN BOLUS FROM INFUSION
333.0000 mL | Freq: Once | INTRAVENOUS | Status: AC
  Administered 2022-10-23: 333 mL via INTRAVENOUS

## 2022-10-23 MED ORDER — AMMONIA AROMATIC IN INHA
RESPIRATORY_TRACT | Status: AC
  Filled 2022-10-23: qty 10

## 2022-10-23 MED ORDER — BENZOCAINE-MENTHOL 20-0.5 % EX AERO
1.0000 | INHALATION_SPRAY | CUTANEOUS | Status: DC | PRN
  Filled 2022-10-23: qty 56

## 2022-10-23 MED ORDER — ACETAMINOPHEN 325 MG PO TABS
650.0000 mg | ORAL_TABLET | ORAL | Status: DC | PRN
  Administered 2022-10-23 – 2022-10-24 (×3): 650 mg via ORAL
  Filled 2022-10-23 (×3): qty 2

## 2022-10-23 MED ORDER — SENNOSIDES-DOCUSATE SODIUM 8.6-50 MG PO TABS
2.0000 | ORAL_TABLET | Freq: Every day | ORAL | Status: DC
  Administered 2022-10-24: 2 via ORAL
  Filled 2022-10-23: qty 2

## 2022-10-23 MED ORDER — SOD CITRATE-CITRIC ACID 500-334 MG/5ML PO SOLN: 30.0000 mL | ORAL | Status: DC | PRN

## 2022-10-23 MED ORDER — FERROUS SULFATE 325 (65 FE) MG PO TABS
325.0000 mg | ORAL_TABLET | Freq: Two times a day (BID) | ORAL | Status: DC
  Administered 2022-10-23 – 2022-10-24 (×2): 325 mg via ORAL
  Filled 2022-10-23 (×2): qty 1

## 2022-10-23 MED ORDER — OXYTOCIN-SODIUM CHLORIDE 30-0.9 UT/500ML-% IV SOLN
2.5000 [IU]/h | INTRAVENOUS | Status: DC
  Administered 2022-10-23: 2.5 [IU]/h via INTRAVENOUS
  Filled 2022-10-23 (×2): qty 500

## 2022-10-23 MED ORDER — OXYTOCIN-SODIUM CHLORIDE 30-0.9 UT/500ML-% IV SOLN
1.0000 m[IU]/min | INTRAVENOUS | Status: DC
  Administered 2022-10-23: 2 m[IU]/min via INTRAVENOUS

## 2022-10-23 MED ORDER — TERBUTALINE SULFATE 1 MG/ML IJ SOLN: 0.2500 mg | Freq: Once | INTRAMUSCULAR | Status: DC | PRN

## 2022-10-23 MED ORDER — ONDANSETRON HCL 4 MG PO TABS: 4.0000 mg | ORAL_TABLET | ORAL | Status: DC | PRN

## 2022-10-23 MED ORDER — LIDOCAINE HCL (PF) 1 % IJ SOLN: 30.0000 mL | INTRAMUSCULAR | Status: DC | PRN

## 2022-10-23 MED ORDER — ACETAMINOPHEN 325 MG PO TABS: 650.0000 mg | ORAL_TABLET | ORAL | Status: DC | PRN

## 2022-10-23 MED ORDER — OXYTOCIN 10 UNIT/ML IJ SOLN
INTRAMUSCULAR | Status: AC
  Filled 2022-10-23: qty 2

## 2022-10-23 MED ORDER — LACTATED RINGERS IV SOLN: 500.0000 mL | INTRAVENOUS | Status: DC | PRN

## 2022-10-23 NOTE — Discharge Summary (Signed)
Obstetrical Discharge Summary  Patient Name: Janice Randall DOB: 08/06/1986 MRN: [id number]  Date of Admission: 10/23/2022 Date of Delivery: 10/23/22 Delivered by: Burton Apley  Date of Discharge: 10/24/2022  Primary OB: [facility] TDV:VOHYWVP'X last menstrual period was 01/22/2022. EDC Estimated Date of Delivery: 10/31/22 Gestational Age at Delivery: [redacted]w[redacted]d   Antepartum complications:  AMA Obesity  Admitting Diagnosis: Normal labor and delivery [O80]  Secondary Diagnosis: SVD Patient Active Problem List   Diagnosis Date Noted   Normal labor and delivery 10/23/2022   Fall 07/12/2022   Encounter for supervision of other normal pregnancy, third trimester 03/27/2022   Labor and delivery, indication for care 12/23/2017   Labor with prolonged first stage, antepartum 10/62/6948   Single umbilical artery affecting management of mother, antepartum, single gestation    Supervision of high risk pregnancy in third trimester 07/03/2017    Discharge Diagnosis: Term Pregnancy Delivered      Augmentation: AROM and Pitocin Complications: None Intrapartum complications/course: admitted in early labor, augmented with AROM and pitocin.  Delivery Type: spontaneous vaginal delivery Anesthesia: IV narcotics Placenta: spontaneous To Pathology: No  Laceration: 1st degree Episiotomy: none Newborn Data: Live born female "Danne Baxter" Birth Weight: 7 lb 6.5 oz (3360 g) APGAR: 8, 9  Newborn Delivery   Birth date/time: 10/23/2022 14:11:00 Delivery type: Vaginal, Spontaneous      Postpartum Procedures: none Edinburgh:     10/23/2022    8:10 PM 12/24/2017    7:45 PM  Edinburgh Postnatal Depression Scale Screening Tool  I have been able to laugh and see the funny side of things. 0 0  I have looked forward with enjoyment to things. 0 0  I have blamed myself unnecessarily when things went wrong. 0 1  I have been anxious or worried for no good reason. 0 0  I have felt scared or panicky for no good  reason. 0 0  Things have been getting on top of me. 0 0  I have been so unhappy that I have had difficulty sleeping. 0 0  I have felt sad or miserable. 0 0  I have been so unhappy that I have been crying. 0 0  The thought of harming myself has occurred to me. 0 0  Edinburgh Postnatal Depression Scale Total 0 1     Post partum course:  Patient had an uncomplicated postpartum course.  By time of discharge on PPD#1, her pain was controlled on oral pain medications; she had appropriate lochia and was ambulating, voiding without difficulty and tolerating regular diet.  She was deemed stable for discharge to home.     Discharge Physical Exam:   BP 105/67 (BP Location: Left Arm)   Pulse 65   Temp 97.9 F (36.6 C) (Oral)   Resp 18   Ht 5' (1.524 m)   Wt 88.9 kg   LMP 01/22/2022   SpO2 98%   Breastfeeding Unknown   BMI 38.28 kg/m   General: NAD CV: RRR Pulm: CTABL, nl effort ABD: s/nd/nt, fundus firm and below the umbilicus Lochia: moderate Perineum: minimal edema/laceration hemostatic DVT Evaluation: LE non-ttp, no evidence of DVT on exam.  Hemoglobin  Date Value Ref Range Status  10/24/2022 11.1 (L) 12.0 - 15.0 g/dL Final   HGB  Date Value Ref Range Status  12/04/2012 13.9 12.0 - 16.0 g/dL Final   HCT  Date Value Ref Range Status  10/24/2022 33.3 (L) 36.0 - 46.0 % Final  12/04/2012 40.8 35.0 - 47.0 % Final    Risk  assessment for postpartum VTE and prophylactic treatment: Very high risk factors: None High risk factors: None Moderate risk factors: BMI 30-40 kg/m2  Postpartum VTE prophylaxis with LMWH not indicated  Disposition: stable, discharge to home. Baby Feeding: breast feeding Baby Disposition: home with mom  Rh Immune globulin indicated: No Rubella vaccine given: was not indicated Varivax vaccine given: was not indicated Flu vaccine given in AP setting: Yes  Tdap vaccine given in AP setting: Yes   Contraception:  undecided  Prenatal Labs:   Blood  type/Rh O POS   Antibody screen Negative    Rubella immune    Varicella Immune  RPR NR    HBsAg Neg   Hep C NR   HIV Neg    GC neg  Chlamydia neg  Genetic screening cfDNA negative   1 hour GTT 91  3 hour GTT    GBS neg       Plan:  Bilan Tedesco was discharged to home in good condition. Follow-up appointment with delivering provider in 6 weeks.   Discharge Medications: Allergies as of 10/24/2022       Reactions   Justicia Adhatoda (malabar Nut Tree) [justicia Adhatoda] Swelling   Lexapro [escitalopram Oxalate] Shortness Of Breath, Other (See Comments)   Difficulty breathing   Codeine Rash   Flagyl [metronidazole] Rash        Medication List     TAKE these medications    acetaminophen 325 MG tablet Commonly known as: Tylenol Take 2 tablets (650 mg total) by mouth every 4 (four) hours as needed (for pain scale < 4).   benzocaine-Menthol 20-0.5 % Aero Commonly known as: DERMOPLAST Apply 1 Application topically as needed for irritation (perineal discomfort).   ferrous sulfate 325 (65 FE) MG tablet Take 1 tablet (325 mg total) by mouth 2 (two) times daily with a meal.   ibuprofen 600 MG tablet Commonly known as: ADVIL Take 1 tablet (600 mg total) by mouth every 6 (six) hours.   multivitamin-prenatal 27-0.8 MG Tabs tablet Take 1 tablet by mouth daily at 12 noon.   witch hazel-glycerin pad Commonly known as: TUCKS Apply 1 Application topically as needed for hemorrhoids.         Follow-up [facility] Follow up in 6 week(s).   Why: postpartum appointment Contact information: [place] [phone]                Signed: Gertie Fey, [place] 10/24/2022

## 2022-10-23 NOTE — Progress Notes (Signed)
Labor Progress Note  Kayleanna Lorman is a 37 y.o. G3P2002 at [redacted]w[redacted]d by LMP admitted for painful UCs this morning.   Subjective: feeling some UCs, but generally comfortable right now.   Objective: BP 104/73 (BP Location: Left Arm)   Pulse 90   Temp 98.6 F (37 C) (Oral)   Resp 17   Ht 5' (1.524 m)   Wt 88.9 kg   LMP 01/22/2022   BMI 38.28 kg/m  Notable VS details: reviewed  Fetal Assessment: FHT:  FHR: 130 bpm, variability: moderate,  accelerations:  Present,  decelerations:  Absent Category/reactivity:  Category I UC:   irregular, every 2-5 minutes; Pitocin at 87mu/min SVE:   3-4/50/-2, midposition, soft.  - AROM performed, mod amount clear fluid noted.  - scant brown bloody show noted.   Membrane status:AROM at 1610 Amniotic color: clear  Labs: Lab Results  Component Value Date   WBC 13.3 (H) 10/23/2022   HGB 11.8 (L) 10/23/2022   HCT 35.2 (L) 10/23/2022   MCV 94.4 10/23/2022   PLT 135 (L) 10/23/2022    Assessment / Plan: Augmentation of labor, progressing well G3P2002 at [redacted]w[redacted]d   Labor:  augmentation started at 0641; AROM performed now.   Preeclampsia:   n/a Fetal Wellbeing:  Category I Pain Control:  Labor support without medications; plans nitrous or IVPM I/D:  GBS Neg Anticipated MOD:  NSVD  Murray Hodgkins Dilara Navarrete, CNM 10/23/2022, 9:00 AM

## 2022-10-23 NOTE — Progress Notes (Signed)
Patient moved from OBS[facility] to [facility] for admission. Monitors reapplied at 0512.

## 2022-10-23 NOTE — OB Triage Note (Signed)
Patient is a G3P2002 at 38wk6d coming in for ctx every 3-4 minutes. Patient reports ctx pain 4/10 in her abdomen that radiates to her back. Patient denies vaginal bleeding, headache, or blurry vision. Patient reports +FM. Patient also reports feeling gush of fluid upon arrival to unit. Initial FHT 135bpm. SVE completed by RN is 2cm/60%. CNM notified of patient's arrival to unit.

## 2022-10-23 NOTE — H&P (Signed)
OB History & Physical   History of Present Illness:   Chief Complaint: uterine contractions  HPI:  Janice Randall is a 37 y.o. G19P2002 female at [redacted]w[redacted]d, Patient's last menstrual period was 01/22/2022., consistent with [place] at [redacted]w[redacted]d, with Estimated Date of Delivery: 10/31/22.  She presents to L&D for uterine contractions  Reports active fetal movement  Contractions: started at 2300 on 10/22/22 LOF/SROM: intact Vaginal bleeding: none  Factors complicating pregnancy:  AMA Obesity HX of 2v cord with G2  Patient Active Problem List   Diagnosis Date Noted   Normal labor and delivery 10/23/2022   Fall 07/12/2022   Labor and delivery, indication for care 12/23/2017   Labor with prolonged first stage, antepartum 97/11/6376   Single umbilical artery affecting management of mother, antepartum, single gestation    Supervision of high risk pregnancy in third trimester 07/03/2017    Prenatal Transfer Tool  Maternal Diabetes: No Genetic Screening: Normal Maternal Ultrasounds/Referrals: Normal Fetal Ultrasounds or other Referrals:  None Maternal Substance Abuse:  No Significant Maternal Medications:  None Significant Maternal Lab Results: Group B Strep negative  Maternal Medical History:   Past Medical History:  Diagnosis Date   Anemia    Anxiety    Complication of anesthesia     Past Surgical History:  Procedure Laterality Date   NO PAST SURGERIES      Allergies  Allergen Reactions   Justicia Adhatoda (Malabar Nut Tree) [Justicia Adhatoda] Swelling   Lexapro [Escitalopram Oxalate] Shortness Of Breath and Other (See Comments)    Difficulty breathing   Codeine Rash   Flagyl [Metronidazole] Rash    Prior to Admission medications   Medication Sig Start Date End Date Taking? Authorizing Provider  aspirin EC 81 MG tablet Take 81 mg by mouth daily. Swallow whole.   Yes [provider]  Prenatal Vit-Fe Fumarate-FA (MULTIVITAMIN-PRENATAL) 27-0.8 MG TABS tablet Take 1 tablet by  mouth daily at 12 noon.   Yes [provider]     Prenatal care site:  [facility]  OB History  Gravida Para Term Preterm AB Living  3 2 2  0 0 2  SAB IAB Ectopic Multiple Live Births  0 0 0 0 2    # Outcome Date GA Lbr Len/2nd Weight Sex Delivery Anes PTL Lv  3 Current           2 Term 12/23/17 [redacted]w[redacted]d 08:08 / 00:22 5885 g M Vag-Spont Other  LIV     Name: Owens Loffler     Apgar1: 7  Apgar5: 9  1 Term 03/29/12    F Vag-Spont   LIV     Social History: She  reports that she has never smoked. She has never been exposed to tobacco smoke. She has never used smokeless tobacco. She reports that she does not drink alcohol and does not use drugs.  Family History: family history is not on file.   Review of Systems: A full review of systems was performed and negative except as noted in the HPI.     Physical Exam:  Vital Signs: BP 106/64 (BP Location: Right Arm)   Pulse 68   Temp 97.6 F (36.4 C) (Oral)   Ht 5' (1.524 m)   LMP 01/22/2022   BMI 33.79 kg/m   General: no acute distress.  HEENT: normocephalic, atraumatic Heart: regular rate & rhythm Lungs: normal respiratory effort Abdomen: soft, gravid, non-tender;  EFW: 7 lbs Pelvic:   External: Normal external female genitalia  Cervix: Dilation: 4 /  Effacement (%): 60 / Station: -3    Extremities: non-tender, symmetric, no edema bilaterally.  DTRs: +2  Neurologic: Alert & oriented x 3.    Results for orders placed or performed during the hospital encounter of 10/23/22 (from the past 24 hour(s))  ROM Plus ([facility] only)     Status: None   Collection Time: 10/23/22  3:16 AM  Result Value Ref Range   Rom Plus NEGATIVE     Pertinent Results:  Prenatal Labs: Blood type/Rh O POS   Antibody screen Negative    Rubella immune    Varicella Immune  RPR NR    HBsAg Neg   Hep C NR   HIV Neg    GC neg  Chlamydia neg  Genetic screening cfDNA negative   1 hour GTT 91  3 hour GTT   GBS neg     FHT:  FHR: 135  bpm, variability: moderate,  accelerations:  Present,  decelerations:  Absent Category/reactivity:  Category I UC:   regular, every 2-4 minutes   Cephalic by [place] and SVE   No results found.  Assessment:  Janice Randall is a 37 y.o. G57P2002 female at [redacted]w[redacted]d with uterine contractions.   Plan:  1. Admit to Labor & Delivery - consents reviewed and obtained - Dr. Glennon Mac notified of admission and plan of care   2. Fetal Well being  - Fetal Tracing: Category 1 - Group B Streptococcus ppx not indicated: GBS negative - Presentation: cephalic confirmed by [place]   3. Routine OB: - Prenatal labs reviewed, as above - Rh positive - CBC, T&S, RPR on admit - Clear liquid diet , continuous IV fluids  4. Monitoring of labor  - Contractions monitored with external toco - Pelvis proven to 8.3 lbs a - Plan for expectant management  - Augmentation with oxytocin and AROM as appropriate  - Plan for  continuous fetal monitoring - Maternal pain control as desired; planning unmedicated labor support options  - Anticipate vaginal delivery  5. Post Partum Planning: - Infant feeding: breast feeding - Contraception:  undecided - Tdap vaccine: Given prenatally - Flu vaccine: Given prenatally -RSV vaccine: Given prenataly  Odon [place], CNM 16/10/96 0:45 AM  Avelino Leeds, CNM Certified Nurse Midwife [facility]

## 2022-10-24 LAB — CBC
HCT: 33.3 % — ABNORMAL LOW (ref 36.0–46.0)
Hemoglobin: 11.1 g/dL — ABNORMAL LOW (ref 12.0–15.0)
MCH: 31.9 pg (ref 26.0–34.0)
MCHC: 33.3 g/dL (ref 30.0–36.0)
MCV: 95.7 fL (ref 80.0–100.0)
Platelets: 130 10*3/uL — ABNORMAL LOW (ref 150–400)
RBC: 3.48 MIL/uL — ABNORMAL LOW (ref 3.87–5.11)
RDW: 13.6 % (ref 11.5–15.5)
WBC: 14.6 10*3/uL — ABNORMAL HIGH (ref 4.0–10.5)
nRBC: 0 % (ref 0.0–0.2)

## 2022-10-24 MED ORDER — BENZOCAINE-MENTHOL 20-0.5 % EX AERO: 1.0000 | INHALATION_SPRAY | CUTANEOUS | Status: AC | PRN

## 2022-10-24 MED ORDER — ACETAMINOPHEN 325 MG PO TABS: 650.0000 mg | ORAL_TABLET | ORAL | Status: AC | PRN

## 2022-10-24 MED ORDER — FERROUS SULFATE 325 (65 FE) MG PO TABS: 325.0000 mg | ORAL_TABLET | Freq: Two times a day (BID) | ORAL | 3 refills | Status: AC

## 2022-10-24 MED ORDER — IBUPROFEN 600 MG PO TABS: 600.0000 mg | ORAL_TABLET | Freq: Four times a day (QID) | ORAL | 0 refills | Status: AC

## 2022-10-24 MED ORDER — WITCH HAZEL-GLYCERIN EX PADS: 1.0000 | MEDICATED_PAD | CUTANEOUS | 12 refills | Status: AC | PRN

## 2022-10-24 NOTE — Lactation Note (Signed)
This note was copied from a baby's chart. Lactation Consultation Note  Patient Name: Janice Randall OVFIE'P Date: 10/24/2022 Reason for consult: Maternal discharge (Anticipated discharge 1/23) Age:38 hours  Maternal Data Does the patient have breastfeeding experience prior to this delivery?: Yes (EBF 2 siblings 8 months) How long did the patient breastfeed?: 8 months This is mom's third baby, SVD 1/22 with previous BF history with no complications or interventions reported. Pt plans to EBF on demand at breast until returning to work in April and will utilize DEBP to sustain BF. Maternal history of anxiety per chart assessment.  Feeding Mother's Current Feeding Choice: Breast Milk     Infant sleeping upon [employer] student arrival, no feed observed. Mother reports no breast or nipple pain and that infant is latching well with audible sucks/swallows observed during feeds. Baby is nursing every 2 hours on demand per hunger cue demonstration and is being exclusively breastfed with no pump/hand expression at this time. Pt will return to work in April and is familiar with how to utilize home DEBP.    [employer] student advised 24 hour age approaching and that normative infant behavior will likely result in cluster feeding to facilitate development/maturation of milk supply. Educated pt about engorgement management and how to manage any pain or discomfort as well as when to notify PCP about any increased severity of symptoms concerning potential mastitis. Continue monitor output of wet and dirty diapers as indication of intake and follow up with outpatient clinic or pediatrician LC if any additional feeding concerns arise.                 Discharge Discharge Education: Engorgement and breast care;Warning signs for feeding baby, reviewed breastfeeding basics and outpatient resources.   Consult Status Consult Status: PRN  Update provided to care nurse.  Rodena Medin 10/24/2022, 11:04 AM

## 2022-10-24 NOTE — Progress Notes (Signed)
Brief Nutrition Note  Received call from nutritional services, who reports pt has food allergies and is requesting RD verify allergies with pt.   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 She carries an epi pen for this. Updated allergies in the medical record.   Plan for pt to discharge today; pt has active discharge orders.   Janice Randall, RD, LDN, [place] Registered Dietitian II Certified Diabetes Care and Education Specialist Please refer to [facility] for RD and/or RD on-call/weekend/after hours pager

## 2022-10-24 NOTE — Progress Notes (Signed)
Pt discharged with infant.  Discharge instructions, prescriptions and follow up appointment given to and reviewed with pt. Pt verbalized understanding. Escorted out by auxillary. 

## 2022-12-27 ENCOUNTER — Telehealth: Payer: Self-pay

## 2022-12-27 NOTE — Telephone Encounter (Signed)
[facility]- Discharge Call Backs. Spoke with pt on the phone about the following below. 1-Do you have any questions or concerns about yourself as you heal? 2-Any concerns or questions about your baby? 3-Reviewed ABC's of safe sleep. 4-How was your stay at the hospital? 5-How did our team work together to care for you? You should be receiving a survey in the mail soon.   We would really appreciate it if you could fill that out for [place] and return it in the mail.  We value the feedback to make improvements and continue the great work we do.   If you have any questions please feel free to call me back at [phone]
